# Patient Record
Sex: Female | Born: 1985 | Race: Black or African American | Hispanic: No | Marital: Single | Smoking: Never smoker
Health system: Southern US, Community
[De-identification: ages and names within clinical notes are randomized; demographics above are authoritative.]

## PROBLEM LIST (undated history)

## (undated) DIAGNOSIS — Z789 Other specified health status: Secondary | ICD-10-CM

## (undated) DIAGNOSIS — I10 Essential (primary) hypertension: Secondary | ICD-10-CM

## (undated) HISTORY — DX: Essential (primary) hypertension: I10

---
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
## 2007-12-14 ENCOUNTER — Other Ambulatory Visit: Admission: RE | Admit: 2007-12-14 | Discharge: 2007-12-14 | Payer: Self-pay | Admitting: Obstetrics and Gynecology

## 2010-04-15 ENCOUNTER — Encounter: Admission: RE | Admit: 2010-04-15 | Discharge: 2010-04-15 | Payer: Self-pay | Admitting: Obstetrics and Gynecology

## 2010-10-25 NOTE — Op Note (Signed)
Megan Moss, Megan Moss           ACCOUNT NO.:  [id number]   MEDICAL RECORD NO.:  [id number]          PATIENT TYPE:  AMB   LOCATION:  DSC                          FACILITY:  [facility]   PHYSICIAN:  Rose Phi. Maple Hudson, M.D.   DATE OF BIRTH:  04/29/1986   DATE OF PROCEDURE:  05/29/2006  DATE OF DISCHARGE:                               OPERATIVE REPORT   PREOPERATIVE DIAGNOSIS:  Bilateral fibroadenomas of the breast.   POSTOPERATIVE DIAGNOSIS:  Bilateral fibroadenomas of the breast.   OPERATION:  Excision of bilateral breast masses.   SURGEON:  Rose Phi. Maple Hudson, M.D.   ANESTHESIA:  General.   OPERATIVE PROCEDURE:  This 25 year old female had presented with masses  about six months ago.  At that time, she had ultrasound, and it was  suggested that she have a 7-month follow-up ultrasound.  On the 9-month  follow-up, both masses seemed to have gotten somewhat larger, and she  was then referred to [place] for evaluation for excision.   After suitable general anesthesia was induced, the patient was placed in  the supine position with arms extended on the arm board.  Both breasts  were prepped and draped in the usual fashion.   The palpable mass on the left side was right at the 2:30 to 3 o'clock  position, so I designed a circumareolar incision and made the incision  and exposed the fibroadenoma.  A traction suture was placed in it and  then, with traction, we excised it.  Had good hemostasis with cautery.  I reconstructed the deeper breast tissue with interrupted 3-0 Vicryl and  then a subcuticular closure of 4-0 Monocryl with Dermabond was carried  out.   We then turned our attention to the right side.  This palpable area,  which was larger than the left, was at the 11:30 to 12 o'clock position.  Again, a circumareolar incision was made with exposure of the  fibroadenoma, and then we excised it, cutting with the cautery.   Again, with good hemostasis obtained with cautery, I reconstructed  the  deeper breast tissue to eliminate the defect.  The skin was approximated  with a subcuticular 4-0 Monocryl with Dermabond.   Dressings and then an Ace bandage were applied and the patient  transferred to the recovery room in satisfactory condition having  tolerated the procedure well.     Rose Phi. Maple Hudson, M.D.  Electronically Signed    PRY/MEDQ  D:  05/29/2006  T:  05/30/2006  Job:  [id number]

## 2011-07-16 ENCOUNTER — Other Ambulatory Visit: Payer: Self-pay | Admitting: Obstetrics and Gynecology

## 2011-07-16 DIAGNOSIS — N63 Unspecified lump in unspecified breast: Secondary | ICD-10-CM

## 2011-07-18 ENCOUNTER — Inpatient Hospital Stay: Admission: RE | Admit: 2011-07-18 | Payer: Self-pay | Source: Ambulatory Visit

## 2011-07-31 ENCOUNTER — Ambulatory Visit
Admission: RE | Admit: 2011-07-31 | Discharge: 2011-07-31 | Disposition: A | Discharge status: Home / Self Care | Payer: BC Managed Care – PPO | Source: Ambulatory Visit | Attending: Obstetrics and Gynecology | Admitting: Obstetrics and Gynecology

## 2011-07-31 DIAGNOSIS — N63 Unspecified lump in unspecified breast: Secondary | ICD-10-CM

## 2013-06-30 ENCOUNTER — Other Ambulatory Visit: Payer: Self-pay | Admitting: *Deleted

## 2013-06-30 NOTE — Telephone Encounter (Signed)
Incoming [facility] from [facility] requesting Nuvaring.   Last AEX 08/11/2012. Last refill 08/11/12 #3/3 refills. Next AEX scheduled for 08/12/2013  Patient should have enough refills until her appt on 08/2013. We will refill medication for 1 year when she comes for her AEX.

## 2013-08-09 ENCOUNTER — Encounter: Payer: Self-pay | Admitting: Obstetrics and Gynecology

## 2013-08-12 ENCOUNTER — Ambulatory Visit: Payer: Self-pay | Admitting: Obstetrics and Gynecology

## 2013-08-12 ENCOUNTER — Encounter: Payer: Self-pay | Admitting: Obstetrics and Gynecology

## 2014-05-26 ENCOUNTER — Ambulatory Visit (INDEPENDENT_AMBULATORY_CARE_PROVIDER_SITE_OTHER): Payer: BC Managed Care – PPO

## 2014-05-26 ENCOUNTER — Ambulatory Visit (INDEPENDENT_AMBULATORY_CARE_PROVIDER_SITE_OTHER): Payer: BC Managed Care – PPO | Admitting: Family Medicine

## 2014-05-26 VITALS — BP 118/70 | HR 80 | Temp 97.9°F | Resp 18 | Ht 68.0 in | Wt 166.0 lb

## 2014-05-26 DIAGNOSIS — M79671 Pain in right foot: Secondary | ICD-10-CM

## 2014-05-26 DIAGNOSIS — S93601A Unspecified sprain of right foot, initial encounter: Secondary | ICD-10-CM

## 2014-05-26 MED ORDER — DICLOFENAC SODIUM 75 MG PO TBEC: 75.0000 mg | DELAYED_RELEASE_TABLET | Freq: Two times a day (BID) | ORAL | Status: DC

## 2014-05-26 NOTE — Progress Notes (Signed)
° °  Subjective:    Patient ID: Megan Moss, female    DOB: 1985-07-29, 28 y.o.   MRN: [id number] This chart was scribed for Elvina SidleKurt Lauenstein, MD by Littie Deedsichard Sun, Medical Scribe. This patient was seen in Room 3 and the patient's care was started at 10:17 AM.    HPI HPI Comments: Megan Moss is a [age] y.o. female who presents to the [facility] complaining of right ankle injury that occurred PTA when she missed a step when coming down stairs. Patient states she twisted her ankle and reports pain to her right ankle and foot. She denies any knee pain. Patient also denies possibility of pregnancy.  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   Review of Systems  Musculoskeletal: Positive for myalgias and arthralgias.       Objective:   Physical Exam CONSTITUTIONAL: Well developed/well nourished HEAD: Normocephalic/atraumatic EYES: EOM/PERRL ENMT: Mucous membranes moist NECK: supple no meningeal signs SPINE: entire spine nontender CV: S1/S2 noted, no murmurs/rubs/gallops noted LUNGS: Lungs are clear to auscultation bilaterally, no apparent distress ABDOMEN: soft, nontender, no rebound or guarding GU: no cva tenderness NEURO: Pt is awake/alert, moves all extremitiesx4 EXTREMITIES: pulses normal, full ROM SKIN: warm, color normal PSYCH: no abnormalities of mood noted  [facility] reading (PRIMARY) by  Dr. Milus GlazierLauenstein:  Negative foot and ankle films.   Mild swelling dorsal right foot     Assessment & Plan:   This chart was scribed in my presence and reviewed by me personally.    ICD-9-CM ICD-10-CM   1. Right foot pain 729.5 M79.671 DG Ankle Complete Right     DG Foot Complete Right     diclofenac (VOLTAREN) 75 MG EC tablet  2. Right foot sprain, initial encounter 845.10 S93.601A diclofenac (VOLTAREN) 75 MG EC tablet   Right foot pain - Plan: DG Ankle Complete Right, DG Foot Complete Right, diclofenac  (VOLTAREN) 75 MG EC tablet  Right foot sprain, initial encounter - Plan: diclofenac (VOLTAREN) 75 MG EC tablet    Signed, Elvina SidleKurt Lauenstein, MD

## 2014-05-26 NOTE — Patient Instructions (Signed)
Foot Sprain The muscles and cord like structures which attach muscle to bone (tendons) that surround the feet are made up of units. A foot sprain can occur at the weakest spot in any of these units. This condition is most often caused by injury to or overuse of the foot, as from playing contact sports, or aggravating a previous injury, or from poor conditioning, or obesity. SYMPTOMS  Pain with movement of the foot.  Tenderness and swelling at the injury site.  Loss of strength is present in moderate or severe sprains. THE THREE GRADES OR SEVERITY OF FOOT SPRAIN ARE:  Mild (Grade I): Slightly pulled muscle without tearing of muscle or tendon fibers or loss of strength.  Moderate (Grade II): Tearing of fibers in a muscle, tendon, or at the attachment to bone, with small decrease in strength.  Severe (Grade III): Rupture of the muscle-tendon-bone attachment, with separation of fibers. Severe sprain requires surgical repair. Often repeating (chronic) sprains are caused by overuse. Sudden (acute) sprains are caused by direct injury or over-use. DIAGNOSIS  Diagnosis of this condition is usually by your own observation. If problems continue, a caregiver may be required for further evaluation and treatment. X-rays may be required to make sure there are not breaks in the bones (fractures) present. Continued problems may require physical therapy for treatment. PREVENTION  Use strength and conditioning exercises appropriate for your sport.  Warm up properly prior to working out.  Use athletic shoes that are made for the sport you are participating in.  Allow adequate time for healing. Early return to activities makes repeat injury more likely, and can lead to an unstable arthritic foot that can result in prolonged disability. Mild sprains generally heal in 3 to 10 days, with moderate and severe sprains taking 2 to 10 weeks. Your caregiver can help you determine the proper time required for  healing. HOME CARE INSTRUCTIONS   Apply ice to the injury for 15-20 minutes, 03-04 times per day. Put the ice in a plastic bag and place a towel between the bag of ice and your skin.  An elastic wrap (like an Ace bandage) may be used to keep swelling down.  Keep foot above the level of the heart, or at least raised on a footstool, when swelling and pain are present.  Try to avoid use other than gentle range of motion while the foot is painful. Do not resume use until instructed by your caregiver. Then begin use gradually, not increasing use to the point of pain. If pain does develop, decrease use and continue the above measures, gradually increasing activities that do not cause discomfort, until you gradually achieve normal use.  Use crutches if and as instructed, and for the length of time instructed.  Keep injured foot and ankle wrapped between treatments.  Massage foot and ankle for comfort and to keep swelling down. Massage from the toes up towards the knee.  Only take over-the-counter or prescription medicines for pain, discomfort, or fever as directed by your caregiver. SEEK IMMEDIATE MEDICAL CARE IF:   Your pain and swelling increase, or pain is not controlled with medications.  You have loss of feeling in your foot or your foot turns cold or blue.  You develop new, unexplained symptoms, or an increase of the symptoms that brought you to your caregiver. MAKE SURE YOU:   Understand these instructions.  Will watch your condition.  Will get help right away if you are not doing well or get worse. Document Released:   11/15/2001 Document Revised: 08/18/2011 Document Reviewed: 01/13/2008 ExitCare Patient Information 2015 [employer]. This information is not intended to replace advice given to you by your health care provider. Make sure you discuss any questions you have with your health care provider.  

## 2016-01-13 IMAGING — CR DG ANKLE COMPLETE 3+V*R*
3 series · 3 of 3 positions shown · non-contrast
Comparison: None.

CLINICAL DATA: Tripping injury, fall, lateral ankle pain.

EXAM:
RIGHT ANKLE - COMPLETE 3+ VIEW

[AP]
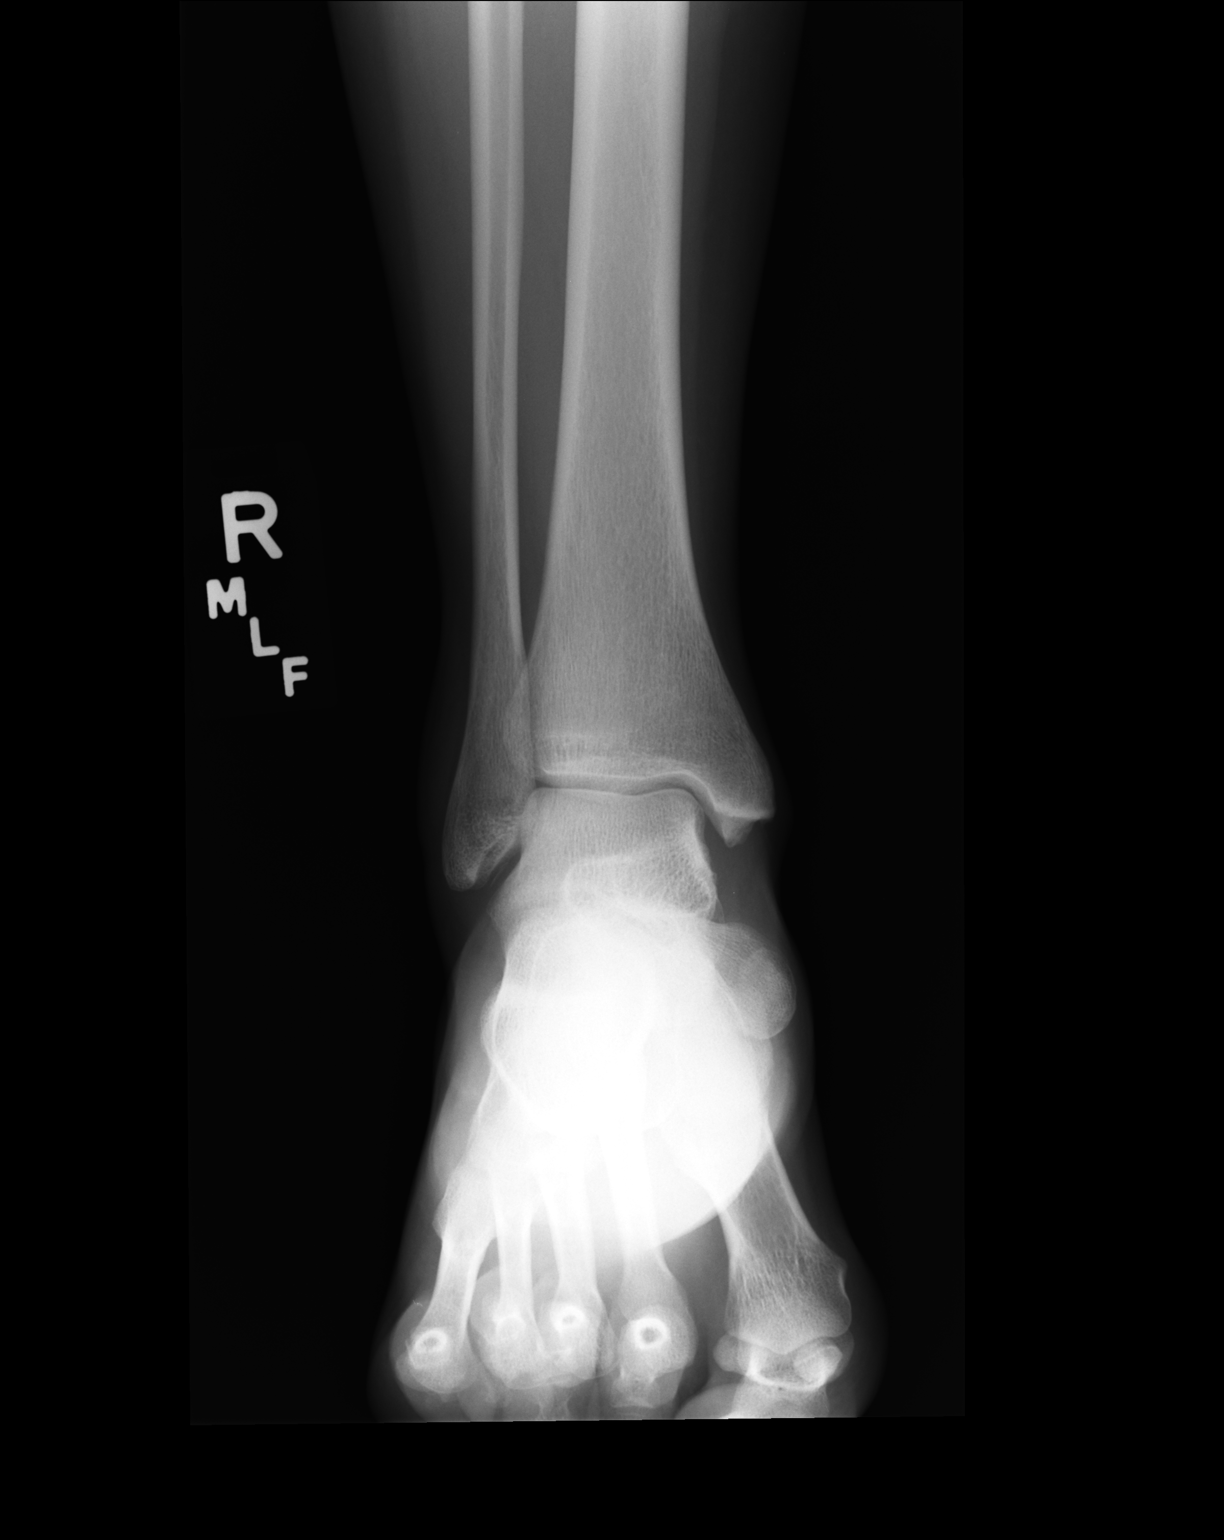

[ap obl int rot]
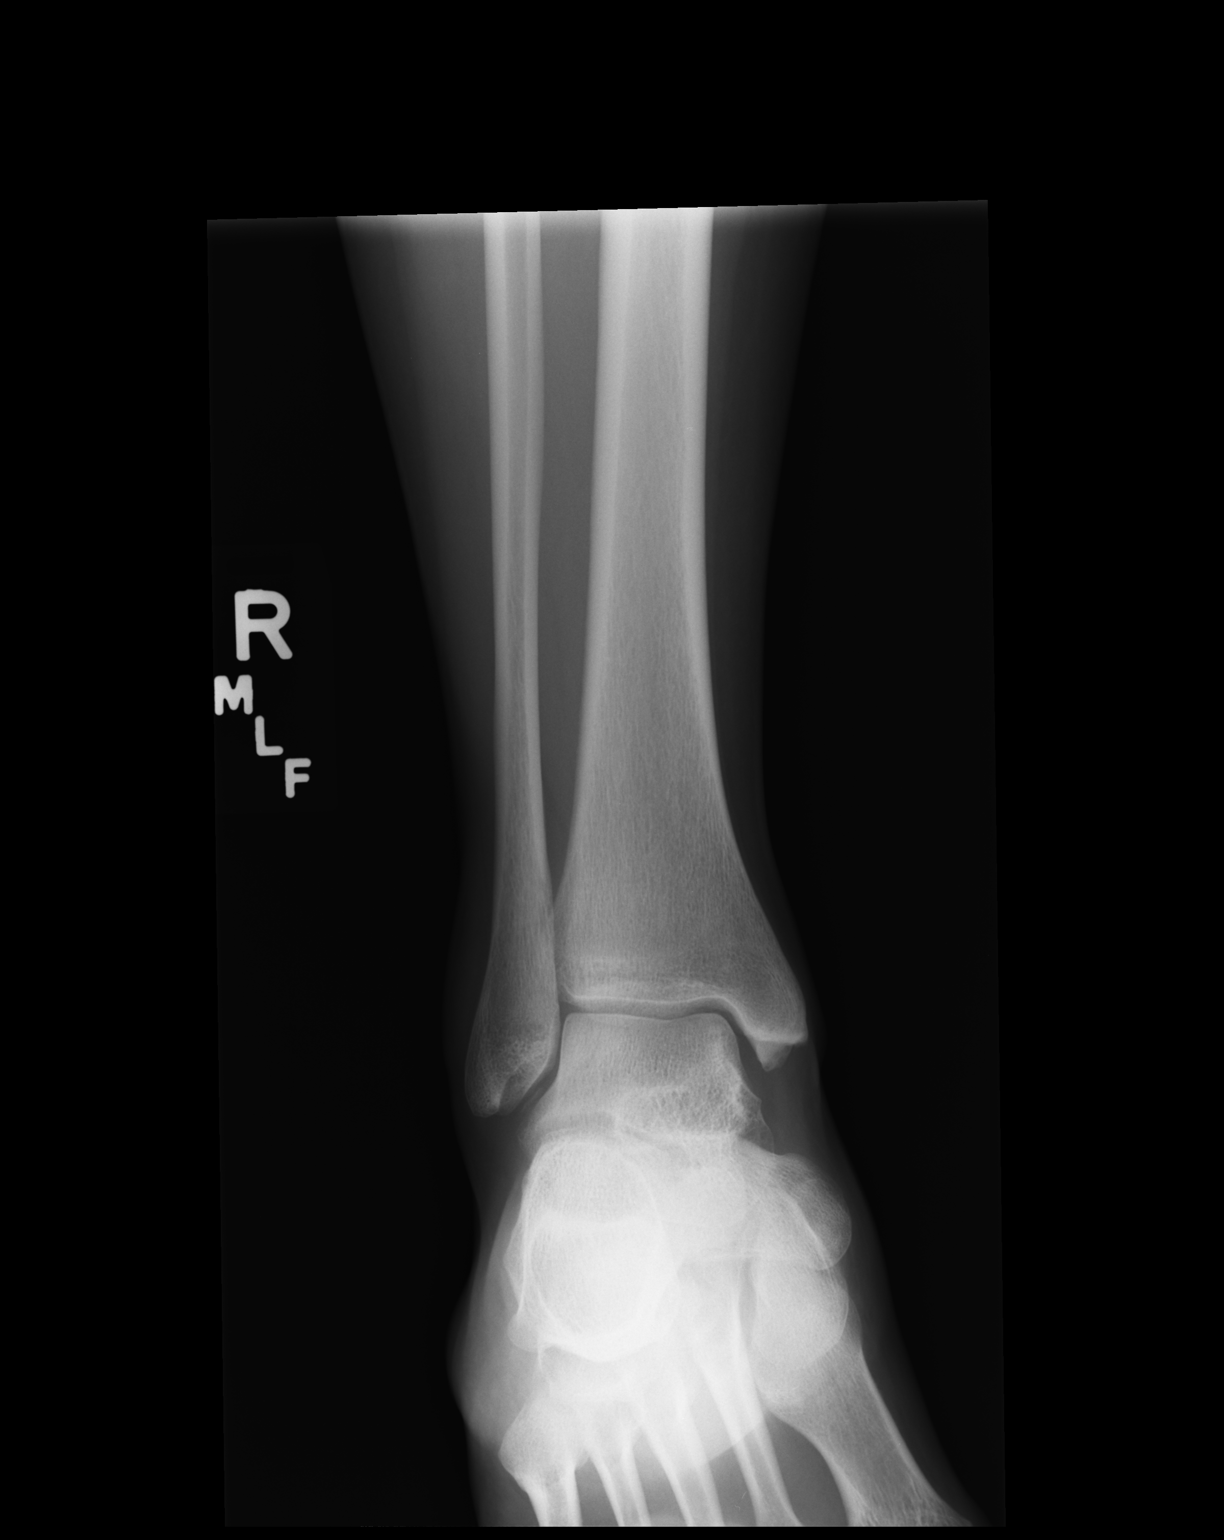

[ap obl ext rot]
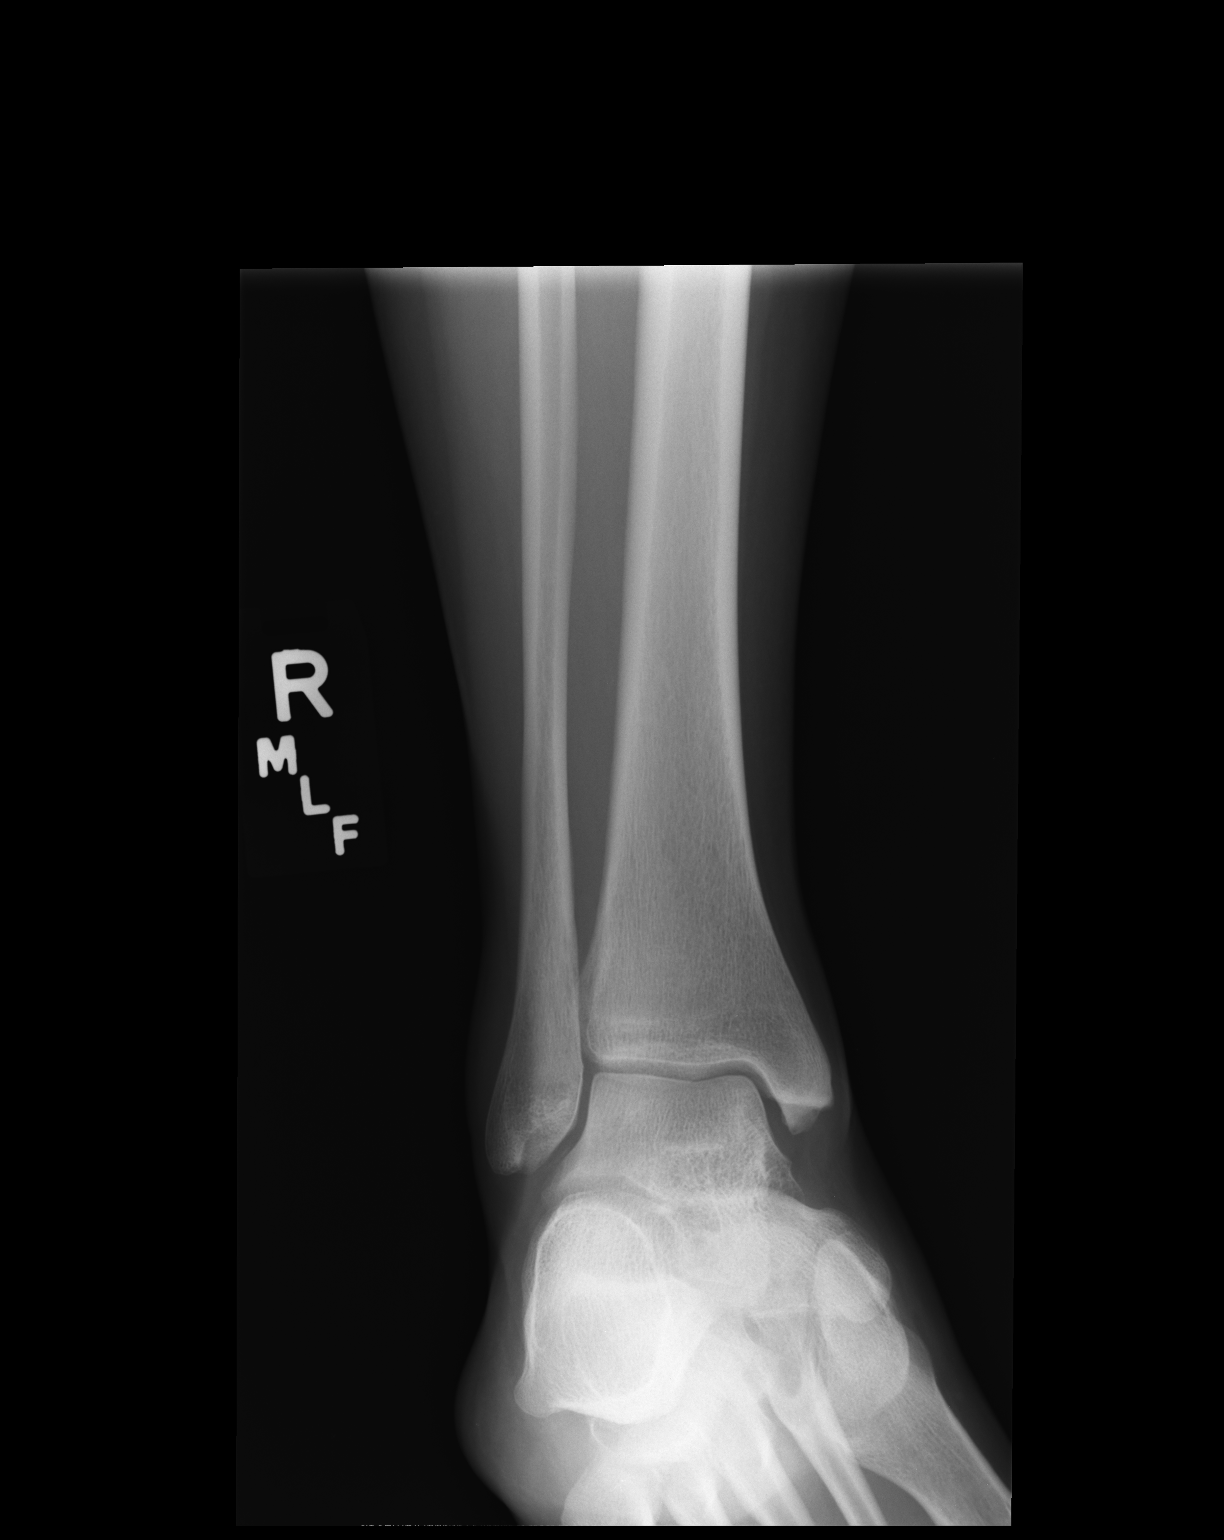

[3 of 3 positions shown; findings below may reference images not displayed]

FINDINGS: A linear osseous density is seen along the medial margin of the
distal fibula. Otherwise, no evidence of fracture. Minimal soft
tissue swelling over the lateral malleolus.
IMPRESSION: Suspect a small nondisplaced avulsion fracture off the medial margin
of the distal fibula.

## 2018-01-18 NOTE — Progress Notes (Signed)
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 I supported by listening and  giving spiritual support. She went back to school and  She graduated this July.

## 2018-08-16 ENCOUNTER — Encounter (HOSPITAL_COMMUNITY): Payer: Self-pay | Admitting: Emergency Medicine

## 2018-08-16 ENCOUNTER — Ambulatory Visit (HOSPITAL_COMMUNITY)
Admission: EM | Admit: 2018-08-16 | Discharge: 2018-08-16 | Disposition: A | Discharge status: Home / Self Care | Payer: 59 | Attending: Internal Medicine | Admitting: Internal Medicine

## 2018-08-16 ENCOUNTER — Telehealth (HOSPITAL_COMMUNITY): Payer: Self-pay | Admitting: Emergency Medicine

## 2018-08-16 DIAGNOSIS — L03032 Cellulitis of left toe: Secondary | ICD-10-CM

## 2018-08-16 MED ORDER — IBUPROFEN 800 MG PO TABS: 800.0000 mg | ORAL_TABLET | Freq: Three times a day (TID) | ORAL | 0 refills | Status: DC

## 2018-08-16 MED ORDER — FLUCONAZOLE 150 MG PO TABS: 150.0000 mg | ORAL_TABLET | Freq: Every day | ORAL | 0 refills | Status: DC

## 2018-08-16 MED ORDER — CEPHALEXIN 500 MG PO CAPS: 500.0000 mg | ORAL_CAPSULE | Freq: Four times a day (QID) | ORAL | 0 refills | Status: DC

## 2018-08-16 NOTE — Discharge Instructions (Signed)
Start keflex as directed. Ibuprofen 800mg  three times a day for pain and swelling. Warm compress. Monitor for spreading redness, increased warmth, follow up for reevaluation needed.  Diflucan called in, can start if having yeast symptoms such as vaginal itching, discharge.

## 2018-08-16 NOTE — ED Triage Notes (Signed)
Pt sts toe pain on left foot after having pedicure; pt sts swollen and painful

## 2018-08-16 NOTE — ED Provider Notes (Signed)
[facility]    CSN: [id number] Arrival date & time: 08/16/18  1128     History   Chief Complaint Chief Complaint  Patient presents with  . Foot Pain    HPI Megan Moss is a 33 y.o. female.   33 year old female comes in for few day history of left toe pain after pedicure. States pain is now causing trouble walking. Denies spreading erythema, warmth, fever. Denies numbness/tingling. Has not taken anything for the symptoms.      Past Medical History:  Diagnosis Date  . Hypertension     There are no active problems to display for this patient.   Past Surgical History:  Procedure Laterality Date  . BREAST FIBROADENOMA SURGERY Bilateral 2007    OB History    Gravida  0   Para  0   Term  0   Preterm  0   AB  0   Living  0     SAB  0   TAB  0   Ectopic  0   Multiple  0   Live Births               Home Medications    Prior to Admission medications   Medication Sig Start Date End Date Taking? Authorizing Provider  cephALEXin (KEFLEX) 500 MG capsule Take 1 capsule (500 mg total) by mouth 4 (four) times daily. 08/16/18   Belinda Fisher, PA-C  etonogestrel-ethinyl estradiol (NUVARING) 0.12-0.015 MG/24HR vaginal ring Place 1 each vaginally every 28 (twenty-eight) days. Insert vaginally and leave in place for 3 consecutive weeks, then remove for 1 week.    [provider]  fluconazole (DIFLUCAN) 150 MG tablet Take 1 tablet (150 mg total) by mouth daily. Take second dose 72 hours later if symptoms still persists. 08/16/18   Belinda Fisher, PA-C    Family History Family History  Problem Relation Age of Onset  . Hypertension Mother   . Heart murmur Mother 67  . Hypertension Father   . Diabetes Maternal Grandmother   . Hypertension Maternal Grandmother   . Diabetes Maternal Grandfather   . Cancer Maternal Grandfather        prostate cancer  . Hypertension Maternal Grandfather   . Diabetes Paternal Grandmother   . Hypertension Paternal  Grandmother   . Diabetes Paternal Grandfather   . Hypertension Paternal Grandfather     Social History Social History   Tobacco Use  . Smoking status: Never Smoker  Substance Use Topics  . Alcohol use: No    Alcohol/week: 0.0 standard drinks  . Drug use: No     Allergies   Patient has no known allergies.   Review of Systems Review of Systems  Reason unable to perform ROS: See HPI as above.     Physical Exam Triage Vital Signs ED Triage Vitals  Enc Vitals Group     BP 08/16/18 1210 125/70     Pulse Rate 08/16/18 1210 (!) 59     Resp 08/16/18 1210 18     Temp 08/16/18 1210 98.2 F (36.8 C)     Temp Source 08/16/18 1210 Temporal     SpO2 08/16/18 1210 100 %     Weight --      Height --      Head Circumference --      Peak Flow --      Pain Score 08/16/18 1211 9     Pain Loc --  Pain Edu? --      Excl. in GC? --    No data found.  Updated Vital Signs BP 125/70 (BP Location: Right Arm)   Pulse (!) 59   Temp 98.2 F (36.8 C) (Temporal)   Resp 18   SpO2 100%   Physical Exam Constitutional:      General: She is not in acute distress.    Appearance: She is well-developed. She is not diaphoretic.  HENT:     Head: Normocephalic and atraumatic.  Eyes:     Conjunctiva/sclera: Conjunctivae normal.     Pupils: Pupils are equal, round, and reactive to light.  Musculoskeletal:     Comments: Swelling to the cuticles of left 1-3rd toe. Left great toe with erythema, warmth. Small blister to the great toe. Tenderness to palpation along the cuticles. Decreased ROM due to pain. Sensation intact. Pedal pulse 2+, cap refill <2s  Neurological:     Mental Status: She is alert and oriented to person, place, and time.    UC Treatments / Results  Labs (all labs ordered are listed, but only abnormal results are displayed) Labs Reviewed - No data to display  EKG None  Radiology No results found.  Procedures Procedures (including critical care  time)  Medications Ordered in UC Medications - No data to display  Initial Impression / Assessment and Plan / UC Course  I have reviewed the triage vital signs and the nursing notes.  Pertinent labs & imaging results that were available during my care of the patient were reviewed by me and considered in my medical decision making (see chart for details).    Start keflex as directed. Warm compress. NSAIDs for pain and inflammation. Return precautions given. Patient expresses understanding and agrees to plan.  Final Clinical Impressions(s) / UC Diagnoses   Final diagnoses:  Cellulitis of toe of left foot    ED Prescriptions    Medication Sig Dispense Auth. Provider   cephALEXin (KEFLEX) 500 MG capsule Take 1 capsule (500 mg total) by mouth 4 (four) times daily. 28 capsule Ethin Drummond V, PA-C   fluconazole (DIFLUCAN) 150 MG tablet Take 1 tablet (150 mg total) by mouth daily. Take second dose 72 hours later if symptoms still persists. 2 tablet Threasa Alpha, PA-C 08/16/18 1318

## 2018-08-20 ENCOUNTER — Other Ambulatory Visit: Payer: Self-pay

## 2018-08-20 ENCOUNTER — Ambulatory Visit (HOSPITAL_COMMUNITY)
Admission: EM | Admit: 2018-08-20 | Discharge: 2018-08-20 | Disposition: A | Discharge status: Home / Self Care | Payer: 59 | Attending: Family Medicine | Admitting: Family Medicine

## 2018-08-20 DIAGNOSIS — L03032 Cellulitis of left toe: Secondary | ICD-10-CM | POA: Diagnosis not present

## 2018-08-20 MED ORDER — CEPHALEXIN 500 MG PO CAPS: 500.0000 mg | ORAL_CAPSULE | Freq: Four times a day (QID) | ORAL | 0 refills | Status: DC

## 2018-08-20 NOTE — Discharge Instructions (Signed)
Continue Keflex for 7 more days Continue warm soaks 1-2 times a day Take the Diflucan 1 now and then 1 again in 7 days return if you fail to improve Consider establishing with a PCP

## 2018-08-20 NOTE — ED Triage Notes (Signed)
Pt returning due to infection on her left foot after a pedicure. Swelling has gone down but still has a puss pocket and dark toes. No fevers, Nor chills and pt able to move toes and was not able to when this happen

## 2018-08-20 NOTE — ED Provider Notes (Signed)
[facility]    CSN: [id number] Arrival date & time: 08/20/18  1640     History   Chief Complaint Chief Complaint  Patient presents with  . Follow-up    HPI Megan Moss is a 33 y.o. female.   HPI Patient is taking her Keflex as requested.  She did not take any Diflucan.  She has some peeling between her toes and around the edges of her toenail.  The skin around the edges of the toenails on the left foot for the first second third toes has turned dark.  She wants to know if this is healing appropriately.  The pain is much improved.  Swelling is down.  No drainage.  The first toenail appears to be thickened as if it is going to fall off, there is polish on the nails which makes it very difficult to do further evaluation Past Medical History:  Diagnosis Date  . Hypertension     There are no active problems to display for this patient.   Past Surgical History:  Procedure Laterality Date  . BREAST FIBROADENOMA SURGERY Bilateral 2007    OB History    Gravida  0   Para  0   Term  0   Preterm  0   AB  0   Living  0     SAB  0   TAB  0   Ectopic  0   Multiple  0   Live Births               Home Medications    Prior to Admission medications   Medication Sig Start Date End Date Taking? Authorizing Provider  cephALEXin (KEFLEX) 500 MG capsule Take 1 capsule (500 mg total) by mouth 4 (four) times daily. 08/20/18   Eustace Moore, MD  etonogestrel-ethinyl estradiol (NUVARING) 0.12-0.015 MG/24HR vaginal ring Place 1 each vaginally every 28 (twenty-eight) days. Insert vaginally and leave in place for 3 consecutive weeks, then remove for 1 week.    [provider]  fluconazole (DIFLUCAN) 150 MG tablet Take 1 tablet (150 mg total) by mouth daily. Take second dose 72 hours later if symptoms still persists. 08/16/18   Cathie Hoops, Amy V, PA-C  ibuprofen (ADVIL,MOTRIN) 800 MG tablet Take 1 tablet (800 mg total) by mouth 3 (three) times daily.  08/16/18   Belinda Fisher, PA-C    Family History Family History  Problem Relation Age of Onset  . Hypertension Mother   . Heart murmur Mother 5  . Hypertension Father   . Diabetes Maternal Grandmother   . Hypertension Maternal Grandmother   . Diabetes Maternal Grandfather   . Cancer Maternal Grandfather        prostate cancer  . Hypertension Maternal Grandfather   . Diabetes Paternal Grandmother   . Hypertension Paternal Grandmother   . Diabetes Paternal Grandfather   . Hypertension Paternal Grandfather     Social History Social History   Tobacco Use  . Smoking status: Never Smoker  Substance Use Topics  . Alcohol use: No    Alcohol/week: 0.0 standard drinks  . Drug use: No     Allergies   Patient has no known allergies.   Review of Systems Review of Systems  Constitutional: Negative for chills and fever.  HENT: Negative for ear pain and sore throat.   Eyes: Negative for pain and visual disturbance.  Respiratory: Negative for cough and shortness of breath.   Cardiovascular: Negative for chest pain and palpitations.  Gastrointestinal: Negative for abdominal pain and vomiting.  Genitourinary: Negative for dysuria and hematuria.  Musculoskeletal: Negative for arthralgias and back pain.  Skin: Positive for color change. Negative for rash.  Neurological: Negative for seizures and syncope.  All other systems reviewed and are negative.    Physical Exam Triage Vital Signs ED Triage Vitals  Enc Vitals Group     BP 08/20/18 1750 (!) 131/98     Pulse Rate 08/20/18 1750 72     Resp 08/20/18 1750 16     Temp 08/20/18 1750 98.5 F (36.9 C)     Temp Source 08/20/18 1750 Oral     SpO2 08/20/18 1750 100 %     Weight 08/20/18 1752 165 lb (74.8 kg)     Height 08/20/18 1752 5\' 6"  (1.676 m)   No data found.  Updated Vital Signs BP (!) 131/98 (BP Location: Right Arm)   Pulse 72   Temp 98.5 F (36.9 C) (Oral)   Resp 16   Ht 5\' 6"  (1.676 m)   Wt 74.8 kg   SpO2 100%    BMI 26.63 kg/m       Physical Exam Constitutional:      General: She is not in acute distress.    Appearance: She is well-developed and normal weight.  HENT:     Head: Normocephalic and atraumatic.  Eyes:     Conjunctiva/sclera: Conjunctivae normal.     Pupils: Pupils are equal, round, and reactive to light.  Neck:     Musculoskeletal: Normal range of motion.  Cardiovascular:     Rate and Rhythm: Normal rate and regular rhythm.     Heart sounds: Normal heart sounds.  Pulmonary:     Effort: Pulmonary effort is normal. No respiratory distress.     Breath sounds: Normal breath sounds.  Abdominal:     General: There is no distension.     Palpations: Abdomen is soft.  Musculoskeletal: Normal range of motion.  Skin:    General: Skin is warm and dry.     Comments: Toes on the L foot 1 - 2 - 3 and part of 4th are hyperpigmented.  There is maceration and some blistering between the 1 and 2 toes  Neurological:     Mental Status: She is alert.      UC Treatments / Results  Labs (all labs ordered are listed, but only abnormal results are displayed) Labs Reviewed - No data to display  EKG None  Radiology No results found.  Procedures Procedures (including critical care time)  Medications Ordered in UC Medications - No data to display  Initial Impression / Assessment and Plan / UC Course  I have reviewed the triage vital signs and the nursing notes.  Pertinent labs & imaging results that were available during my care of the patient were reviewed by me and considered in my medical decision making (see chart for details).    Healing - no complication noted  Final Clinical Impressions(s) / UC Diagnoses   Final diagnoses:  Paronychia of great toe, left     Discharge Instructions     Continue Keflex for 7 more days Continue warm soaks 1-2 times a day Take the Diflucan 1 now and then 1 again in 7 days return if you fail to improve Consider establishing with a PCP     ED Prescriptions    Medication Sig Dispense Auth. Provider   cephALEXin (KEFLEX) 500 MG capsule Take 1 capsule (500 mg total) by mouth  4 (four) times daily. 28 capsule Eustace Moore, MD     Controlled Substance Prescriptions [place] Controlled Substance Registry consulted? Not Applicable   Eustace Moore, MD 08/20/18 [phone]

## 2019-06-10 NOTE — L&D Delivery Note (Signed)
Delivery Note At 6:55 AM a viable and healthy female was delivered via Vaginal, Spontaneous (Presentation: Right Occiput Anterior).  APGAR: 9, 9; weight  .   Placenta status: Spontaneous, Intact.  Cord: 3 vessels with the following complications: None.    Pt presented to [facility] completely dilated after being sent home earlier in the morning. She spontaneously ruptured membranes on the way to labor and delivery for meconium stained fluid. She pushed for approximately 20 minutes without epidural and delivered a vigorous female infant in the vertex ROA presentation with apgars of 9 and 9. Pt is GBS positive and did not receive adequate treatment. The placenta delivered spontaneously, intact with 3VC. After a 1 minute delay in cord clamping. A 3rd degree laceration was noted. The perineum was infiltrated with 1% lidocaine. Alice clamps were used to grasp the external sphincter muscle and the 4 interupted sutures were used to reapproximate the muscle on all 4 sides. A finger was placed in the rectum to ensure complete re-approximation. A second degree perineal laceration was repaired with 3-0 vicryl in the usual fashion. EBL 227cc Mother and baby are doing well after delivery.  Anesthesia: 1% lidocaine  Episiotomy: None Lacerations: 3rd degree Suture Repair: 3.0 vicryl, 0 vicryl Est. Blood Loss (mL):  227 cc  Mom to postpartum.  Baby to Couplet care / Skin to Skin.  Waynard Reeds 02/27/2020, 7:46 AM

## 2019-07-04 ENCOUNTER — Inpatient Hospital Stay (HOSPITAL_COMMUNITY): Payer: 59

## 2019-07-04 ENCOUNTER — Other Ambulatory Visit: Payer: Self-pay

## 2019-07-04 ENCOUNTER — Inpatient Hospital Stay (HOSPITAL_COMMUNITY)
Admission: AD | Admit: 2019-07-04 | Discharge: 2019-07-04 | Disposition: A | Discharge status: Home / Self Care | Payer: 59 | Attending: Obstetrics & Gynecology | Admitting: Obstetrics & Gynecology

## 2019-07-04 ENCOUNTER — Encounter (HOSPITAL_COMMUNITY): Payer: Self-pay | Admitting: Obstetrics & Gynecology

## 2019-07-04 DIAGNOSIS — O98811 Other maternal infectious and parasitic diseases complicating pregnancy, first trimester: Secondary | ICD-10-CM | POA: Diagnosis not present

## 2019-07-04 DIAGNOSIS — R109 Unspecified abdominal pain: Secondary | ICD-10-CM | POA: Diagnosis not present

## 2019-07-04 DIAGNOSIS — Z3A08 8 weeks gestation of pregnancy: Secondary | ICD-10-CM | POA: Diagnosis not present

## 2019-07-04 DIAGNOSIS — M549 Dorsalgia, unspecified: Secondary | ICD-10-CM | POA: Insufficient documentation

## 2019-07-04 DIAGNOSIS — Z3491 Encounter for supervision of normal pregnancy, unspecified, first trimester: Secondary | ICD-10-CM | POA: Diagnosis not present

## 2019-07-04 DIAGNOSIS — Z3A01 Less than 8 weeks gestation of pregnancy: Secondary | ICD-10-CM | POA: Diagnosis not present

## 2019-07-04 DIAGNOSIS — B3731 Acute candidiasis of vulva and vagina: Secondary | ICD-10-CM

## 2019-07-04 DIAGNOSIS — O161 Unspecified maternal hypertension, first trimester: Secondary | ICD-10-CM | POA: Insufficient documentation

## 2019-07-04 DIAGNOSIS — O209 Hemorrhage in early pregnancy, unspecified: Secondary | ICD-10-CM | POA: Diagnosis not present

## 2019-07-04 DIAGNOSIS — B373 Candidiasis of vulva and vagina: Secondary | ICD-10-CM | POA: Insufficient documentation

## 2019-07-04 DIAGNOSIS — O26891 Other specified pregnancy related conditions, first trimester: Secondary | ICD-10-CM | POA: Insufficient documentation

## 2019-07-04 LAB — CBC
HCT: 38.4 % (ref 36.0–46.0)
Hemoglobin: 12.7 g/dL (ref 12.0–15.0)
MCH: 29.2 pg (ref 26.0–34.0)
MCHC: 33.1 g/dL (ref 30.0–36.0)
MCV: 88.3 fL (ref 80.0–100.0)
Platelets: 323 10*3/uL (ref 150–400)
RBC: 4.35 MIL/uL (ref 3.87–5.11)
RDW: 13.6 % (ref 11.5–15.5)
WBC: 10.8 10*3/uL — ABNORMAL HIGH (ref 4.0–10.5)
nRBC: 0 % (ref 0.0–0.2)

## 2019-07-04 LAB — HCG, QUANTITATIVE, PREGNANCY: hCG, Beta Chain, Quant, S: 4164 m[IU]/mL — ABNORMAL HIGH (ref <5)

## 2019-07-04 LAB — URINALYSIS, ROUTINE W REFLEX MICROSCOPIC
Bilirubin Urine: NEGATIVE
Glucose, UA: NEGATIVE mg/dL
Hgb urine dipstick: NEGATIVE
Ketones, ur: 5 mg/dL — AB
Leukocytes,Ua: NEGATIVE
Nitrite: NEGATIVE
Protein, ur: NEGATIVE mg/dL
Specific Gravity, Urine: 1.027 (ref 1.005–1.030)
pH: 5 (ref 5.0–8.0)

## 2019-07-04 LAB — WET PREP, GENITAL
Clue Cells Wet Prep HPF POC: NONE SEEN
Sperm: NONE SEEN
Trich, Wet Prep: NONE SEEN

## 2019-07-04 LAB — POCT PREGNANCY, URINE: Preg Test, Ur: POSITIVE — AB

## 2019-07-04 LAB — ABO/RH: ABO/RH(D): O POS

## 2019-07-04 MED ORDER — TERCONAZOLE 80 MG VA SUPP: 80.0000 mg | Freq: Every day | VAGINAL | 0 refills | Status: DC

## 2019-07-04 NOTE — MAU Provider Note (Signed)
Chief Complaint: Abdominal Pain, Back Pain, and Possible Pregnancy   First Provider Initiated Contact with Patient 07/04/19 1739     SUBJECTIVE HPI: Megan Moss is a 34 y.o. G1P0000 at [redacted]w[redacted]d who presents to Maternity Admissions reporting abdominal cramping. Symptoms started this morning. Took a positive pregnancy test yesterday. Reports have a light period in December at the appropriate time for her cycle but no bleeding episode this month. Had regular periods prior to December.  Denies n/v/d, constipation, dysuria, vaginal bleeding, or vaginal discharge.   Location: lower abdomen Quality: cramping Severity: 5/10 on pain scale Duration: 1 day Timing: intermittent Modifying factors: none Associated signs and symptoms: none  Past Medical History:  Diagnosis Date  . Hypertension    OB History  Gravida Para Term Preterm AB Living  1 0 0 0 0 0  SAB TAB Ectopic Multiple Live Births  0 0 0 0      # Outcome Date GA Lbr Len/2nd Weight Sex Delivery Anes PTL Lv  1 Current            Past Surgical History:  Procedure Laterality Date  . BREAST FIBROADENOMA SURGERY Bilateral 2007   Social History   Socioeconomic History  . Marital status: Single    Spouse name: Not on file  . Number of children: Not on file  . Years of education: Not on file  . Highest education level: Not on file  Occupational History  . Not on file  Tobacco Use  . Smoking status: Never Smoker  . Smokeless tobacco: Never Used  Substance and Sexual Activity  . Alcohol use: Yes    Alcohol/week: 0.0 standard drinks  . Drug use: No  . Sexual activity: Yes    Birth control/protection: None  Other Topics Concern  . Not on file  Social History Narrative  . Not on file   Social Determinants of Health   Financial Resource Strain:   . Difficulty of Paying Living Expenses: Not on file  Food Insecurity:   . Worried About Charity fundraiser in the Last Year: Not on file  . Ran Out of Food in the Last  Year: Not on file  Transportation Needs:   . Lack of Transportation (Medical): Not on file  . Lack of Transportation (Non-Medical): Not on file  Physical Activity:   . Days of Exercise per Week: Not on file  . Minutes of Exercise per Session: Not on file  Stress:   . Feeling of Stress : Not on file  Social Connections:   . Frequency of Communication with Friends and Family: Not on file  . Frequency of Social Gatherings with Friends and Family: Not on file  . Attends Religious Services: Not on file  . Active Member of Clubs or Organizations: Not on file  . Attends Archivist Meetings: Not on file  . Marital Status: Not on file  Intimate Partner Violence:   . Fear of Current or Ex-Partner: Not on file  . Emotionally Abused: Not on file  . Physically Abused: Not on file  . Sexually Abused: Not on file   Family History  Problem Relation Age of Onset  . Hypertension Mother   . Heart murmur Mother 79  . Hypertension Father   . Diabetes Maternal Grandmother   . Hypertension Maternal Grandmother   . Diabetes Maternal Grandfather   . Cancer Maternal Grandfather        prostate cancer  . Hypertension Maternal Grandfather   . Diabetes  Paternal Grandmother   . Hypertension Paternal Grandmother   . Diabetes Paternal Grandfather   . Hypertension Paternal Grandfather    No current facility-administered medications on file prior to encounter.   Current Outpatient Medications on File Prior to Encounter  Medication Sig Dispense Refill  . cephALEXin (KEFLEX) 500 MG capsule Take 1 capsule (500 mg total) by mouth 4 (four) times daily. 28 capsule 0  . etonogestrel-ethinyl estradiol (NUVARING) 0.12-0.015 MG/24HR vaginal ring Place 1 each vaginally every 28 (twenty-eight) days. Insert vaginally and leave in place for 3 consecutive weeks, then remove for 1 week.    . fluconazole (DIFLUCAN) 150 MG tablet Take 1 tablet (150 mg total) by mouth daily. Take second dose 72 hours later if  symptoms still persists. 2 tablet 0  . ibuprofen (ADVIL,MOTRIN) 800 MG tablet Take 1 tablet (800 mg total) by mouth 3 (three) times daily. 21 tablet 0   No Known Allergies  I have reviewed patient's Past Medical Hx, Surgical Hx, Family Hx, Social Hx, medications and allergies.   Review of Systems  OBJECTIVE Patient Vitals for the past 24 hrs:  BP Temp Temp src Pulse Resp SpO2 Height Weight  07/04/19 1753 126/76 -- -- 91 -- -- -- --  07/04/19 1656 135/76 98.6 F (37 C) Oral 90 16 99 % 5\' 6"  (1.676 m) 84.3 kg   Constitutional: Well-developed, well-nourished female in no acute distress.  Cardiovascular: normal rate & rhythm, no murmur Respiratory: normal rate and effort. Lung sounds clear throughout GI: Abd soft, non-tender, Pos BS x 4. No guarding or rebound tenderness MS: Extremities nontender, no edema, normal ROM Neurologic: Alert and oriented x 4.     LAB RESULTS Results for orders placed or performed during the hospital encounter of 07/04/19 (from the past 24 hour(s))  Urinalysis, Routine w reflex microscopic     Status: Abnormal   Collection Time: 07/04/19  5:10 PM  Result Value Ref Range   Color, Urine YELLOW YELLOW   APPearance CLEAR CLEAR   Specific Gravity, Urine 1.027 1.005 - 1.030   pH 5.0 5.0 - 8.0   Glucose, UA NEGATIVE NEGATIVE mg/dL   Hgb urine dipstick NEGATIVE NEGATIVE   Bilirubin Urine NEGATIVE NEGATIVE   Ketones, ur 5 (A) NEGATIVE mg/dL   Protein, ur NEGATIVE NEGATIVE mg/dL   Nitrite NEGATIVE NEGATIVE   Leukocytes,Ua NEGATIVE NEGATIVE  Pregnancy, urine POC     Status: Abnormal   Collection Time: 07/04/19  5:14 PM  Result Value Ref Range   Preg Test, Ur POSITIVE (A) NEGATIVE  Wet prep, genital     Status: Abnormal   Collection Time: 07/04/19  5:46 PM  Result Value Ref Range   Yeast Wet Prep HPF POC PRESENT (A) NONE SEEN   Trich, Wet Prep NONE SEEN NONE SEEN   Clue Cells Wet Prep HPF POC NONE SEEN NONE SEEN   WBC, Wet Prep HPF POC FEW (A) NONE SEEN    Sperm NONE SEEN   CBC     Status: Abnormal   Collection Time: 07/04/19  6:09 PM  Result Value Ref Range   WBC 10.8 (H) 4.0 - 10.5 K/uL   RBC 4.35 3.87 - 5.11 MIL/uL   Hemoglobin 12.7 12.0 - 15.0 g/dL   HCT 07/06/19 72.5 - 36.6 %   MCV 88.3 80.0 - 100.0 fL   MCH 29.2 26.0 - 34.0 pg   MCHC 33.1 30.0 - 36.0 g/dL   RDW 44.0 34.7 - 42.5 %   Platelets 323 150 -  400 K/uL   nRBC 0.0 0.0 - 0.2 %  ABO/Rh     Status: None   Collection Time: 07/04/19  6:09 PM  Result Value Ref Range   ABO/RH(D) O POS    No rh immune globuloin      NOT A RH IMMUNE GLOBULIN CANDIDATE, PT RH POSITIVE Performed at [facility], [address], [place], [place] [address]   hCG, quantitative, pregnancy     Status: Abnormal   Collection Time: 07/04/19  6:09 PM  Result Value Ref Range   hCG, Beta Chain, Quant, S 4,164 (H) <5 mIU/mL    IMAGING US OB LESS THAN 14 WEEKS WITH OB TRANSVAGINAL  Result Date: 07/04/2019 CLINICAL DATA:  Pain, cramping, vaginal bleeding EXAM: OBSTETRIC <14 WK [place] AND TRANSVAGINAL OB US TECHNIQUE: Both transabdominal and transvaginal ultrasound examinations were performed for complete evaluation of the gestation as well as the maternal uterus, adnexal regions, and pelvic cul-de-sac. Transvaginal technique was performed to assess early pregnancy. COMPARISON:  None. FINDINGS: Intrauterine gestational sac: Single Yolk sac:  Visualized Embryo:  Not visualized Cardiac Activity: Not visualized Heart Rate:   bpm MSD: 5.4 mm   5 w   2 d CRL:    mm    w    d                  [place] EDC: Subchorionic hemorrhage:  None visualized. Maternal uterus/adnexae: No adnexal mass or free fluid. IMPRESSION: Early intrauterine pregnancy. Yolk sac is visualized but no fetal pole currently. Estimated gestational age by mean sac diameter 5 weeks 2 days. This could be followed with repeat ultrasound in 14 days to ensure expected progression. No acute maternal findings. Electronically Signed   By: Charlett Nose M.D.   On:  07/04/2019 19:01    MAU COURSE Orders Placed This Encounter  Procedures  . Wet prep, genital  . US OB LESS THAN 14 WEEKS WITH OB TRANSVAGINAL  . US OB Transvaginal  . Urinalysis, Routine w reflex microscopic  . CBC  . hCG, quantitative, pregnancy  . Pregnancy, urine POC  . ABO/Rh  . Discharge patient   Meds ordered this encounter  Medications  . terconazole (TERAZOL 3) 80 MG vaginal suppository    Sig: Place 1 suppository (80 mg total) vaginally at bedtime.    Dispense:  3 suppository    Refill:  0    Order Specific Question:   Supervising Provider    Answer:   Adam Phenix [[id number]]    MDM +UPT UA, wet prep, GC/chlamydia, CBC, ABO/Rh, quant hCG, and [place] today to rule out ectopic pregnancy which can be life threatening.   Ultrasound shows IUGS with yolk sac.  Will order viability ultrasound in 10 days.  Patient does not have an ob/gyn, will give list of providers  Wet prep + for yeast. Patient denies symptoms. Will send in rx for terazol if needed.   ASSESSMENT 1. Normal IUP (intrauterine pregnancy) on prenatal ultrasound, first trimester   2. Abdominal pain during pregnancy in first trimester   3. Vaginal yeast infection     PLAN Discharge home in stable condition. Discussed reasons to return to MAU F/u viability ultrasound ordered Rx terazol  Follow-up Information    [facility].   Specialty: Radiology Why: schedule ultrasound Contact information: [address] [place] [address], [address] [id number] [place] [address] [phone]         Allergies as  of 07/04/2019   No Known Allergies     Medication List    STOP taking these medications   cephALEXin 500 MG capsule Commonly known as: KEFLEX   etonogestrel-ethinyl estradiol 0.12-0.015 MG/24HR vaginal ring Commonly known as: NUVARING   fluconazole 150 MG tablet Commonly known as: Diflucan   ibuprofen 800 MG tablet Commonly known  as: ADVIL     TAKE these medications   terconazole 80 MG vaginal suppository Commonly known as: TERAZOL 3 Place 1 suppository (80 mg total) vaginally at bedtime.        Judeth Horn, NP 07/04/2019  7:23 PM

## 2019-07-04 NOTE — MAU Note (Signed)
In Dec, had some spotting, missed Jan period.  Took 4 preg tests yesterday, all were +.  Has had some cramping and back pain, just wanting to see what is going on.

## 2019-07-04 NOTE — Discharge Instructions (Signed)
Return to care   If you have heavier bleeding that soaks through more that 2 pads per hour for an hour or more  If you bleed so much that you feel like you might pass out or you do pass out  If you have significant abdominal pain that is not improved with Tylenol   If you develop a fever > 100.5    Prenatal Care Providers           [facility] [place]   Phone: [phone]  [facility] @ [place]   Phone: [phone]  [facility]       Phone: [phone]            [facility] @ [place]     Phone: [phone]          [facility] @ [facility]   Phone: [phone]  [facility] @ [place]  Phone: [phone]  [facility] @ [facility] Phone: [phone]     [facility]  Phone: [phone]  [facility]  Phone: [phone]  [phone] OB[facility]GY[facility] Phone: [phone]  [facility] Phone: [phone]  [facility] Phone: [phone]  [facility] Phone: [phone]  [facility]  Phone: [phone]     Vaginal Yeast Infection, Adult  Vaginal yeast infection is a condition that causes vaginal discharge as well as soreness, swelling, and redness (inflammation) of the vagina. This is a common condition. Some women get this infection frequently. What are the causes? This condition is caused by a change in the normal balance of the yeast (candida) and bacteria that live in the vagina. This change causes an overgrowth of yeast, which causes the inflammation. What increases the risk? The condition is more likely to develop in women who:  Take antibiotic medicines.  Have diabetes.  Take birth control pills.  Are pregnant.  Douche often.  Have a weak body defense system (immune system).  Have been taking steroid medicines for a long  time.  Frequently wear tight clothing. What are the signs or symptoms? Symptoms of this condition include:  White, thick, creamy vaginal discharge.  Swelling, itching, redness, and irritation of the vagina. The lips of the vagina (vulva) may be affected as well.  Pain or a burning feeling while urinating.  Pain during sex. How is this diagnosed? This condition is diagnosed based on:  Your medical history.  A physical exam.  A pelvic exam. Your health care provider will examine a sample of your vaginal discharge under a microscope. Your health care provider may send this sample for testing to confirm the diagnosis. How is this treated? This condition is treated with medicine. Medicines may be over-the-counter or prescription. You may be told to use one or more of the following:  Medicine that is taken by mouth (orally).  Medicine that is applied as a cream (topically).  Medicine that is inserted directly into the vagina (suppository). Follow these instructions at home:  Lifestyle  Do not have sex until your health care provider approves. Tell your sex partner that you have a yeast infection. That person should go to his or her health care provider and ask if they should also be treated.  Do not wear tight clothes, such as pantyhose or tight pants.  Wear breathable cotton underwear. General instructions  Take or apply over-the-counter and prescription medicines only as told by your health  care provider.  Eat more yogurt. This may help to keep your yeast infection from returning.  Do not use tampons until your health care provider approves.  Try taking a sitz bath to help with discomfort. This is a warm water bath that is taken while you are sitting down. The water should only come up to your hips and should cover your buttocks. Do this 3-4 times per day or as told by your health care provider.  Do not douche.  If you have diabetes, keep your blood sugar levels under  control.  Keep all follow-up visits as told by your health care provider. This is important. Contact a health care provider if:  You have a fever.  Your symptoms go away and then return.  Your symptoms do not get better with treatment.  Your symptoms get worse.  You have new symptoms.  You develop blisters in or around your vagina.  You have blood coming from your vagina and it is not your menstrual period.  You develop pain in your abdomen. Summary  Vaginal yeast infection is a condition that causes discharge as well as soreness, swelling, and redness (inflammation) of the vagina.  This condition is treated with medicine. Medicines may be over-the-counter or prescription.  Take or apply over-the-counter and prescription medicines only as told by your health care provider.  Do not douche. Do not have sex or use tampons until your health care provider approves.  Contact a health care provider if your symptoms do not get better with treatment or your symptoms go away and then return. This information is not intended to replace advice given to you by your health care provider. Make sure you discuss any questions you have with your health care provider. Document Revised: 12/24/2018 Document Reviewed: 10/12/2017 Elsevier Patient Education  [place].

## 2019-07-05 LAB — GC/CHLAMYDIA PROBE AMP (~~LOC~~) NOT AT ARMC
Chlamydia: NEGATIVE
Comment: NEGATIVE
Comment: NORMAL
Neisseria Gonorrhea: NEGATIVE

## 2019-07-12 DIAGNOSIS — Z01419 Encounter for gynecological examination (general) (routine) without abnormal findings: Secondary | ICD-10-CM | POA: Diagnosis not present

## 2019-07-12 DIAGNOSIS — Z124 Encounter for screening for malignant neoplasm of cervix: Secondary | ICD-10-CM | POA: Diagnosis not present

## 2019-07-12 DIAGNOSIS — N925 Other specified irregular menstruation: Secondary | ICD-10-CM | POA: Diagnosis not present

## 2019-07-12 DIAGNOSIS — Z3480 Encounter for supervision of other normal pregnancy, unspecified trimester: Secondary | ICD-10-CM | POA: Diagnosis not present

## 2019-07-12 DIAGNOSIS — O26841 Uterine size-date discrepancy, first trimester: Secondary | ICD-10-CM | POA: Diagnosis not present

## 2019-08-23 DIAGNOSIS — Z3480 Encounter for supervision of other normal pregnancy, unspecified trimester: Secondary | ICD-10-CM | POA: Diagnosis not present

## 2019-08-23 DIAGNOSIS — Z3682 Encounter for antenatal screening for nuchal translucency: Secondary | ICD-10-CM | POA: Diagnosis not present

## 2019-08-23 LAB — OB RESULTS CONSOLE RPR: RPR: NONREACTIVE

## 2019-08-23 LAB — OB RESULTS CONSOLE HIV ANTIBODY (ROUTINE TESTING): HIV: NONREACTIVE

## 2019-08-23 LAB — OB RESULTS CONSOLE GC/CHLAMYDIA
Chlamydia: NEGATIVE
Gonorrhea: NEGATIVE

## 2019-08-23 LAB — OB RESULTS CONSOLE ABO/RH: RH Type: POSITIVE

## 2019-08-23 LAB — OB RESULTS CONSOLE RUBELLA ANTIBODY, IGM: Rubella: IMMUNE

## 2019-08-23 LAB — OB RESULTS CONSOLE ANTIBODY SCREEN: Antibody Screen: NEGATIVE

## 2019-08-23 LAB — OB RESULTS CONSOLE HEPATITIS B SURFACE ANTIGEN: Hepatitis B Surface Ag: NEGATIVE

## 2019-09-21 DIAGNOSIS — Z3482 Encounter for supervision of other normal pregnancy, second trimester: Secondary | ICD-10-CM | POA: Diagnosis not present

## 2019-09-21 DIAGNOSIS — Z369 Encounter for antenatal screening, unspecified: Secondary | ICD-10-CM | POA: Diagnosis not present

## 2019-10-11 DIAGNOSIS — O99212 Obesity complicating pregnancy, second trimester: Secondary | ICD-10-CM | POA: Diagnosis not present

## 2019-10-11 DIAGNOSIS — Z363 Encounter for antenatal screening for malformations: Secondary | ICD-10-CM | POA: Diagnosis not present

## 2019-11-10 DIAGNOSIS — Z363 Encounter for antenatal screening for malformations: Secondary | ICD-10-CM | POA: Diagnosis not present

## 2019-12-15 DIAGNOSIS — Z3480 Encounter for supervision of other normal pregnancy, unspecified trimester: Secondary | ICD-10-CM | POA: Diagnosis not present

## 2019-12-15 DIAGNOSIS — Z23 Encounter for immunization: Secondary | ICD-10-CM | POA: Diagnosis not present

## 2020-01-11 DIAGNOSIS — Z369 Encounter for antenatal screening, unspecified: Secondary | ICD-10-CM | POA: Diagnosis not present

## 2020-01-25 DIAGNOSIS — O26843 Uterine size-date discrepancy, third trimester: Secondary | ICD-10-CM | POA: Diagnosis not present

## 2020-01-30 DIAGNOSIS — Z3482 Encounter for supervision of other normal pregnancy, second trimester: Secondary | ICD-10-CM | POA: Diagnosis not present

## 2020-01-30 DIAGNOSIS — Z3483 Encounter for supervision of other normal pregnancy, third trimester: Secondary | ICD-10-CM | POA: Diagnosis not present

## 2020-02-03 DIAGNOSIS — O365999 Maternal care for other known or suspected poor fetal growth, unspecified trimester, other fetus: Secondary | ICD-10-CM | POA: Diagnosis not present

## 2020-02-06 ENCOUNTER — Other Ambulatory Visit: Payer: Self-pay

## 2020-02-06 ENCOUNTER — Inpatient Hospital Stay: Payer: 59 | Attending: Oncology

## 2020-02-06 DIAGNOSIS — Z23 Encounter for immunization: Secondary | ICD-10-CM | POA: Diagnosis not present

## 2020-02-10 DIAGNOSIS — Z369 Encounter for antenatal screening, unspecified: Secondary | ICD-10-CM | POA: Diagnosis not present

## 2020-02-10 DIAGNOSIS — O365939 Maternal care for other known or suspected poor fetal growth, third trimester, other fetus: Secondary | ICD-10-CM | POA: Diagnosis not present

## 2020-02-10 DIAGNOSIS — Z3480 Encounter for supervision of other normal pregnancy, unspecified trimester: Secondary | ICD-10-CM | POA: Diagnosis not present

## 2020-02-10 LAB — OB RESULTS CONSOLE GBS: GBS: POSITIVE

## 2020-02-14 DIAGNOSIS — O365931 Maternal care for other known or suspected poor fetal growth, third trimester, fetus 1: Secondary | ICD-10-CM | POA: Diagnosis not present

## 2020-02-15 ENCOUNTER — Inpatient Hospital Stay (HOSPITAL_COMMUNITY)
Admission: AD | Admit: 2020-02-15 | Discharge: 2020-02-15 | Disposition: A | Discharge status: Home / Self Care | Payer: 59 | Attending: Obstetrics and Gynecology | Admitting: Obstetrics and Gynecology

## 2020-02-15 ENCOUNTER — Encounter (HOSPITAL_COMMUNITY): Payer: Self-pay | Admitting: Obstetrics and Gynecology

## 2020-02-15 ENCOUNTER — Other Ambulatory Visit: Payer: Self-pay

## 2020-02-15 DIAGNOSIS — R252 Cramp and spasm: Secondary | ICD-10-CM | POA: Diagnosis not present

## 2020-02-15 DIAGNOSIS — Z3A37 37 weeks gestation of pregnancy: Secondary | ICD-10-CM | POA: Diagnosis not present

## 2020-02-15 DIAGNOSIS — O26893 Other specified pregnancy related conditions, third trimester: Secondary | ICD-10-CM | POA: Insufficient documentation

## 2020-02-15 DIAGNOSIS — Z3689 Encounter for other specified antenatal screening: Secondary | ICD-10-CM | POA: Diagnosis not present

## 2020-02-15 DIAGNOSIS — O10913 Unspecified pre-existing hypertension complicating pregnancy, third trimester: Secondary | ICD-10-CM | POA: Diagnosis not present

## 2020-02-15 DIAGNOSIS — N898 Other specified noninflammatory disorders of vagina: Secondary | ICD-10-CM

## 2020-02-15 DIAGNOSIS — O99891 Other specified diseases and conditions complicating pregnancy: Secondary | ICD-10-CM

## 2020-02-15 DIAGNOSIS — Z7982 Long term (current) use of aspirin: Secondary | ICD-10-CM | POA: Diagnosis not present

## 2020-02-15 LAB — POCT FERN TEST
POCT Fern Test: NEGATIVE
POCT Fern Test: NEGATIVE

## 2020-02-15 NOTE — MAU Note (Signed)
Pt reports brownish watery discharge this morning. C/o Small amount of pelvic pressure and cramping

## 2020-02-15 NOTE — MAU Provider Note (Signed)
History   [id number]   Chief Complaint  Patient presents with  . Rupture of Membranes    HPI Megan Moss is a 34 y.o. female  G1P0000 @37 .0 wks here with report of leaking watery brown fluid, happened twice this am. Leaking of fluid has not continued. Pt reports no contractions. She denies vaginal bleeding. Last intercourse was not recent. She had a cervical exam yesterday in the office. She reports good fetal movement. All other systems negative.    Patient's last menstrual period was 05/08/2019.  OB History  Gravida Para Term Preterm AB Living  1 0 0 0 0 0  SAB TAB Ectopic Multiple Live Births  0 0 0 0      # Outcome Date GA Lbr Len/2nd Weight Sex Delivery Anes PTL Lv  1 Current             Past Medical History:  Diagnosis Date  . Hypertension     Family History  Problem Relation Age of Onset  . Hypertension Mother   . Heart murmur Mother 84  . Hypertension Father   . Diabetes Maternal Grandmother   . Hypertension Maternal Grandmother   . Diabetes Maternal Grandfather   . Cancer Maternal Grandfather        prostate cancer  . Hypertension Maternal Grandfather   . Diabetes Paternal Grandmother   . Hypertension Paternal Grandmother   . Diabetes Paternal Grandfather   . Hypertension Paternal Grandfather     Social History   Socioeconomic History  . Marital status: Single    Spouse name: Not on file  . Number of children: Not on file  . Years of education: Not on file  . Highest education level: Not on file  Occupational History  . Not on file  Tobacco Use  . Smoking status: Never Smoker  . Smokeless tobacco: Never Used  Vaping Use  . Vaping Use: Never used  Substance and Sexual Activity  . Alcohol use: Yes    Alcohol/week: 0.0 standard drinks  . Drug use: No  . Sexual activity: Yes    Birth control/protection: None  Other Topics Concern  . Not on file  Social History Narrative  . Not on file   Social Determinants of Health    Financial Resource Strain:   . Difficulty of Paying Living Expenses: Not on file  Food Insecurity:   . Worried About 31 in the Last Year: Not on file  . Ran Out of Food in the Last Year: Not on file  Transportation Needs:   . Lack of Transportation (Medical): Not on file  . Lack of Transportation (Non-Medical): Not on file  Physical Activity:   . Days of Exercise per Week: Not on file  . Minutes of Exercise per Session: Not on file  Stress:   . Feeling of Stress : Not on file  Social Connections:   . Frequency of Communication with Friends and Family: Not on file  . Frequency of Social Gatherings with Friends and Family: Not on file  . Attends Religious Services: Not on file  . Active Member of Clubs or Organizations: Not on file  . Attends Programme researcher, broadcasting/film/video Meetings: Not on file  . Marital Status: Not on file    No Known Allergies  No current facility-administered medications on file prior to encounter.   Current Outpatient Medications on File Prior to Encounter  Medication Sig Dispense Refill  . aspirin EC 81 MG tablet Take 81 mg  by mouth daily. Swallow whole.    . Prenatal Vit-Fe Fumarate-FA (MULTIVITAMIN-PRENATAL) 27-0.8 MG TABS tablet Take 1 tablet by mouth daily at 12 noon.    Marland Kitchen terconazole (TERAZOL 3) 80 MG vaginal suppository Place 1 suppository (80 mg total) vaginally at bedtime. 3 suppository 0     Review of Systems  Gastrointestinal: Negative for abdominal pain.  Genitourinary: Positive for vaginal discharge. Negative for vaginal bleeding.     Physical Exam   Vitals:   02/15/20 0757  BP: 125/71  Pulse: 88  Resp: 18  Temp: 98.8 F (37.1 C)  Weight: 92.1 kg  Height: 5\' 6"  (1.676 m)    Physical Exam Vitals and nursing note reviewed. Exam conducted with a chaperone present.  Constitutional:      General: She is not in acute distress.    Appearance: Normal appearance.  HENT:     Head: Normocephalic and atraumatic.   Cardiovascular:     Rate and Rhythm: Normal rate.  Pulmonary:     Effort: Pulmonary effort is normal. No respiratory distress.  Genitourinary:    Comments: SSE: no pool, fern neg;thin brown discharge Musculoskeletal:        General: Normal range of motion.     Cervical back: Normal range of motion.  Skin:    General: Skin is warm and dry.  Neurological:     General: No focal deficit present.     Mental Status: She is alert.  Psychiatric:        Mood and Affect: Mood normal.   EFM: 140 bpm, mod variability, + accels, no decels Toco: rare  Results for orders placed or performed during the hospital encounter of 02/15/20 (from the past 24 hour(s))  POCT fern test     Status: None   Collection Time: 02/15/20  8:53 AM  Result Value Ref Range   POCT Fern Test Negative = intact amniotic membranes    MAU Course  Procedures  MDM Labs ordered and reviewed. No signs of SROM. Discharge likely from recent cervical exam. Stable for discharge home.   Assessment and Plan   1. [redacted] weeks gestation of pregnancy   2. NST (non-stress test) reactive   3. Vaginal discharge during pregnancy in third trimester    Discharge home [facility] as scheduled Labor precautions  Allergies as of 02/15/2020   No Known Allergies     Medication List    STOP taking these medications   terconazole 80 MG vaginal suppository Commonly known as: TERAZOL 3     TAKE these medications   aspirin EC 81 MG tablet Take 81 mg by mouth daily. Swallow whole.   multivitamin-prenatal 27-0.8 MG Tabs tablet Take 1 tablet by mouth daily at 12 noon.       04/16/2020, CNM 02/15/2020 8:54 AM

## 2020-02-15 NOTE — Discharge Instructions (Signed)
Braxton Hicks Contractions °Contractions of the uterus can occur throughout pregnancy, but they are not always a sign that you are in labor. You may have practice contractions called Braxton Hicks contractions. These false labor contractions are sometimes confused with true labor. °What are Braxton Hicks contractions? °Braxton Hicks contractions are tightening movements that occur in the muscles of the uterus before labor. Unlike true labor contractions, these contractions do not result in opening (dilation) and thinning of the cervix. Toward the end of pregnancy (32-34 weeks), Braxton Hicks contractions can happen more often and may become stronger. These contractions are sometimes difficult to tell apart from true labor because they can be very uncomfortable. You should not feel embarrassed if you go to the hospital with false labor. °Sometimes, the only way to tell if you are in true labor is for your health care provider to look for changes in the cervix. The health care provider will do a physical exam and may monitor your contractions. If you are not in true labor, the exam should show that your cervix is not dilating and your water has not broken. °If there are no other health problems associated with your pregnancy, it is completely safe for you to be sent home with false labor. You may continue to have Braxton Hicks contractions until you go into true labor. °How to tell the difference between true labor and false labor °True labor °· Contractions last 30-70 seconds. °· Contractions become very regular. °· Discomfort is usually felt in the top of the uterus, and it spreads to the lower abdomen and low back. °· Contractions do not go away with walking. °· Contractions usually become more intense and increase in frequency. °· The cervix dilates and gets thinner. °False labor °· Contractions are usually shorter and not as strong as true labor contractions. °· Contractions are usually irregular. °· Contractions  are often felt in the front of the lower abdomen and in the groin. °· Contractions may go away when you walk around or change positions while lying down. °· Contractions get weaker and are shorter-lasting as time goes on. °· The cervix usually does not dilate or become thin. °Follow these instructions at home: ° °· Take over-the-counter and prescription medicines only as told by your health care provider. °· Keep up with your usual exercises and follow other instructions from your health care provider. °· Eat and drink lightly if you think you are going into labor. °· If Braxton Hicks contractions are making you uncomfortable: °? Change your position from lying down or resting to walking, or change from walking to resting. °? Sit and rest in a tub of warm water. °? Drink enough fluid to keep your urine pale yellow. Dehydration may cause these contractions. °? Do slow and deep breathing several times an hour. °· Keep all follow-up prenatal visits as told by your health care provider. This is important. °Contact a health care provider if: °· You have a fever. °· You have continuous pain in your abdomen. °Get help right away if: °· Your contractions become stronger, more regular, and closer together. °· You have fluid leaking or gushing from your vagina. °· You pass blood-tinged mucus (bloody show). °· You have bleeding from your vagina. °· You have low back pain that you never had before. °· You feel your baby’s head pushing down and causing pelvic pressure. °· Your baby is not moving inside you as much as it used to. °Summary °· Contractions that occur before labor are   called Braxton Hicks contractions, false labor, or practice contractions. °· Braxton Hicks contractions are usually shorter, weaker, farther apart, and less regular than true labor contractions. True labor contractions usually become progressively stronger and regular, and they become more frequent. °· Manage discomfort from Braxton Hicks contractions  by changing position, resting in a warm bath, drinking plenty of water, or practicing deep breathing. °This information is not intended to replace advice given to you by your health care provider. Make sure you discuss any questions you have with your health care provider. °Document Revised: 05/08/2017 Document Reviewed: 10/09/2016 °Elsevier Patient Education © 2020 [employer] Inc[employer] ° °

## 2020-02-21 DIAGNOSIS — R102 Pelvic and perineal pain: Secondary | ICD-10-CM | POA: Diagnosis not present

## 2020-02-23 DIAGNOSIS — O365939 Maternal care for other known or suspected poor fetal growth, third trimester, other fetus: Secondary | ICD-10-CM | POA: Diagnosis not present

## 2020-02-23 DIAGNOSIS — O368139 Decreased fetal movements, third trimester, other fetus: Secondary | ICD-10-CM | POA: Diagnosis not present

## 2020-02-23 DIAGNOSIS — O26843 Uterine size-date discrepancy, third trimester: Secondary | ICD-10-CM | POA: Diagnosis not present

## 2020-02-24 ENCOUNTER — Encounter (HOSPITAL_COMMUNITY): Payer: Self-pay | Admitting: Obstetrics and Gynecology

## 2020-02-24 ENCOUNTER — Inpatient Hospital Stay (EMERGENCY_DEPARTMENT_HOSPITAL)
Admission: AD | Admit: 2020-02-24 | Discharge: 2020-02-25 | Disposition: A | Discharge status: Home / Self Care | Payer: 59 | Source: Home / Self Care | Attending: Obstetrics and Gynecology | Admitting: Obstetrics and Gynecology

## 2020-02-24 ENCOUNTER — Telehealth (HOSPITAL_COMMUNITY): Payer: Self-pay | Admitting: *Deleted

## 2020-02-24 ENCOUNTER — Other Ambulatory Visit: Payer: Self-pay

## 2020-02-24 DIAGNOSIS — O471 False labor at or after 37 completed weeks of gestation: Secondary | ICD-10-CM | POA: Insufficient documentation

## 2020-02-24 DIAGNOSIS — Z3A38 38 weeks gestation of pregnancy: Secondary | ICD-10-CM | POA: Insufficient documentation

## 2020-02-24 DIAGNOSIS — Z20822 Contact with and (suspected) exposure to covid-19: Secondary | ICD-10-CM | POA: Diagnosis not present

## 2020-02-24 DIAGNOSIS — O99824 Streptococcus B carrier state complicating childbirth: Secondary | ICD-10-CM | POA: Diagnosis not present

## 2020-02-24 DIAGNOSIS — Z23 Encounter for immunization: Secondary | ICD-10-CM | POA: Diagnosis not present

## 2020-02-24 DIAGNOSIS — O479 False labor, unspecified: Secondary | ICD-10-CM

## 2020-02-24 MED ORDER — ZOLPIDEM TARTRATE 5 MG PO TABS: 5.0000 mg | ORAL_TABLET | Freq: Every evening | ORAL | 0 refills | Status: AC | PRN

## 2020-02-24 MED ORDER — ZOLPIDEM TARTRATE 5 MG PO TABS
5.0000 mg | ORAL_TABLET | Freq: Every evening | ORAL | Status: DC | PRN
  Administered 2020-02-24: 5 mg via ORAL
  Filled 2020-02-24: qty 1

## 2020-02-24 NOTE — MAU Note (Signed)
Pt reports contractions since last night, worsening. ? Mucous discharge at home. Reports good fetal movement.

## 2020-02-24 NOTE — Discharge Instructions (Signed)
Braxton Hicks Contractions Contractions of the uterus can occur throughout pregnancy, but they are not always a sign that you are in labor. You may have practice contractions called Braxton Hicks contractions. These false labor contractions are sometimes confused with true labor. What are Braxton Hicks contractions? Braxton Hicks contractions are tightening movements that occur in the muscles of the uterus before labor. Unlike true labor contractions, these contractions do not result in opening (dilation) and thinning of the cervix. Toward the end of pregnancy (32-34 weeks), Braxton Hicks contractions can happen more often and may become stronger. These contractions are sometimes difficult to tell apart from true labor because they can be very uncomfortable. You should not feel embarrassed if you go to the hospital with false labor. Sometimes, the only way to tell if you are in true labor is for your health care provider to look for changes in the cervix. The health care provider will do a physical exam and may monitor your contractions. If you are not in true labor, the exam should show that your cervix is not dilating and your water has not broken. If there are no other health problems associated with your pregnancy, it is completely safe for you to be sent home with false labor. You may continue to have Braxton Hicks contractions until you go into true labor. How to tell the difference between true labor and false labor True labor  Contractions last 30-70 seconds.  Contractions become very regular.  Discomfort is usually felt in the top of the uterus, and it spreads to the lower abdomen and low back.  Contractions do not go away with walking.  Contractions usually become more intense and increase in frequency.  The cervix dilates and gets thinner. False labor  Contractions are usually shorter and not as strong as true labor contractions.  Contractions are usually irregular.  Contractions  are often felt in the front of the lower abdomen and in the groin.  Contractions may go away when you walk around or change positions while lying down.  Contractions get weaker and are shorter-lasting as time goes on.  The cervix usually does not dilate or become thin. Follow these instructions at home:   Take over-the-counter and prescription medicines only as told by your health care provider.  Keep up with your usual exercises and follow other instructions from your health care provider.  Eat and drink lightly if you think you are going into labor.  If Braxton Hicks contractions are making you uncomfortable: ? Change your position from lying down or resting to walking, or change from walking to resting. ? Sit and rest in a tub of warm water. ? Drink enough fluid to keep your urine pale yellow. Dehydration may cause these contractions. ? Do slow and deep breathing several times an hour.  Keep all follow-up prenatal visits as told by your health care provider. This is important. Contact a health care provider if:  You have a fever.  You have continuous pain in your abdomen. Get help right away if:  Your contractions become stronger, more regular, and closer together.  You have fluid leaking or gushing from your vagina.  You pass blood-tinged mucus (bloody show).  You have bleeding from your vagina.  You have low back pain that you never had before.  You feel your baby's head pushing down and causing pelvic pressure.  Your baby is not moving inside you as much as it used to. Summary  Contractions that occur before labor are   called Braxton Hicks contractions, false labor, or practice contractions.  Braxton Hicks contractions are usually shorter, weaker, farther apart, and less regular than true labor contractions. True labor contractions usually become progressively stronger and regular, and they become more frequent.  Manage discomfort from Braxton Hicks contractions  by changing position, resting in a warm bath, drinking plenty of water, or practicing deep breathing. This information is not intended to replace advice given to you by your health care provider. Make sure you discuss any questions you have with your health care provider. Document Revised: 05/08/2017 Document Reviewed: 10/09/2016 Elsevier Patient Education  2020 [employer]  First Stage of Labor Labor is your body's natural process of moving your baby and other structures, including the placenta and umbilical cord, out of your uterus. There are three stages of labor. How long each stage lasts is different for every woman. But certain events happen during each stage that are the same for everyone.  The first stage starts when true labor begins. This stage ends when your cervix, which is the opening from your uterus into your vagina, is completely open (dilated).  The second stage begins when your cervix is fully dilated and you start pushing. This stage ends when your baby is born.  The third stage is the delivery of the organ that nourished your baby during pregnancy (placenta). First stage of labor As your due date gets closer, you may start to notice certain physical changes that mean labor is going to start soon. You may feel that your baby has dropped lower into your pelvis. You may experience irregular, often painless, contractions that go away when you walk around or lie down (Braxton Hicks contractions). This is also called false labor. The first stage of labor begins when you start having contractions that come at regular (evenly spaced) intervals and your cervix starts to get thinner and wider in preparation for your baby to pass through. Birth care providers measure the dilation of your cervix in centimeters (cm). One centimeter is a little less than one-half of an inch. The first stage ends when your cervix is dilated to 10 cm. The first stage of labor is divided into three  phases:  Early phase.  Active phase.  Transitional phase. The length of the first stage of labor varies. It may be longer if this is your first pregnancy. You may spend most of this stage at home trying to relax and stay comfortable. How does this affect me? During the first stage of labor, you will move through three phases. What happens in the early phase?  You will start to have regular contractions that last 30-60 seconds. Contractions may come every 5-20 minutes. Keep track of your contractions and call your birth care provider.  Your water may break during this phase.  You may notice a clear or slightly bloody discharge of mucus (mucus plug) from your vagina.  Your cervix will dilate to 3-6 cm. What happens in the active phase? The active phase usually lasts 3-5 hours. You may go to the hospital or birth center around this time. During the active phase:  Your contractions will become stronger, longer, and more uncomfortable.  Your contractions may last 45-90 seconds and come every 3-5 minutes.  You may feel lower back pain.  Your birth care providers may examine your cervix and feel your belly to find the position of your baby.  You may have a monitor strapped to your belly to measure your contractions and your baby's   heart rate.  You may start using your pain management options.  Your cervix may be dilated to 6 cm and may start to dilate more quickly. What happens in the transitional phase? The transitional phase typically lasts from 30 minutes to 2 hours. At the end of this phase, your cervix will be fully dilated to 10 cm. During the transitional phase:  Contractions will get stronger and longer.  Contractions may last 60-90 seconds and come less than 2 minutes apart.  You may feel hot flashes, chills, or nausea. How does this affect my baby? During the first stage of labor, your baby will gradually move down into your birth canal. Follow these instructions at  home and in the hospital or birth center:   When labor first begins, try to stay calm. You are still in the early phase. If it is night, try to get some sleep. If it is day, try to relax and save your energy. You may want to make some calls and get ready to go to the hospital or birth center.  When you are in the early phase, try these methods to help ease discomfort: ? Deep breathing and muscle relaxation. ? Taking a walk. ? Taking a warm bath or shower.  Drink some fluids and have a light snack if you feel like it.  Keep track of your contractions.  Based on the plan you created with your birth care provider, call when your contractions indicate it is time.  If your water breaks, note the time, color, and odor of the fluid.  When you are in the active phase, do your breathing exercises and rely on your support people and your team of birth care providers. Contact a health care provider if:  Your contractions are strong and regular.  You have lower back pain or cramping.  Your water breaks.  You lose your mucus plug. Get help right away if you:  Have a severe headache that does not go away.  Have changes in your vision.  Have severe pain in your upper belly.  Do not feel the baby move.  Have bright red bleeding. Summary  The first stage of labor starts when true labor begins, and it ends when your cervix is dilated to 10 cm.  The first stage of labor has three phases: early, active, and transitional.  Your baby moves into the birth canal during the first stage of labor.  You may have contractions that become stronger and longer. You may also lose your mucus plug and have your water break.  Call your birth care provider when your contractions are frequent and strong enough to go to the hospital or birth center. This information is not intended to replace advice given to you by your health care provider. Make sure you discuss any questions you have with your health  care provider. Document Revised: 09/16/2018 Document Reviewed: 08/09/2017 Elsevier Patient Education  2020 Else[employer] Inc.  

## 2020-02-24 NOTE — MAU Provider Note (Signed)
None    S: Ms. LICET DUNPHY is a 34 y.o. G1P0000 at [redacted]w[redacted]d  who presents to MAU today complaining of contractions since last night.  She denies vaginal bleeding. She denies LOF. She reports normal fetal movement.    She receives care with [facility]  O: BP 127/73 (BP Location: Right Arm)   Pulse 98   Temp 98.3 F (36.8 C) (Oral)   Resp 18   Ht 5\' 6"  (1.676 m)   Wt 92.1 kg   LMP 05/08/2019   SpO2 98% Comment: ROOM AIR   BMI 32.77 kg/m  GENERAL: Well-developed, well-nourished female in no acute distress.  HEAD: Normocephalic, atraumatic.  CHEST: Normal effort of breathing, regular heart rate ABDOMEN: Soft, nontender, gravid  Cervical exam:  Dilation: 1.5 Effacement (%): 50 Cervical Position: Posterior Station: -2 Presentation: Vertex Exam by:: [email or web address] RN    Fetal Monitoring: Baseline: 135 Variability: Mod Accelerations: 15 x 15 Decelerations: None Contractions: Occasional   A: SIUP at [redacted]w[redacted]d  Cat I tracing Normotensive No cervical change in 90 minutes of monitoring in MAU  False labor   Meds ordered this encounter  Medications  . zolpidem (AMBIEN) 5 MG tablet    Sig: Take 1 tablet (5 mg total) by mouth at bedtime as needed for sleep.    Dispense:  5 tablet    Refill:  0    Order Specific Question:   Supervising Provider    Answer:   EURE, LUTHER H [[id number]]  . zolpidem (AMBIEN) tablet 5 mg   P: Discharge home in stable condition with labor precautions Outpatient rx Ambien per patient request  [redacted]w[redacted]d, CNM 02/25/2020 12:10 AM

## 2020-02-24 NOTE — Telephone Encounter (Signed)
Preadmission screen  

## 2020-02-25 DIAGNOSIS — O471 False labor at or after 37 completed weeks of gestation: Secondary | ICD-10-CM | POA: Diagnosis not present

## 2020-02-25 DIAGNOSIS — Z3A38 38 weeks gestation of pregnancy: Secondary | ICD-10-CM

## 2020-02-25 NOTE — Progress Notes (Signed)
Discharge information given to patient, instructed to return to MAU if contractions became more regular, VB, LOF, decreased FM. Ambien prescription given as well as Ambien 5 mg po before discharge. Patient waited 20 minutes after po medication before leaving ambulatory. Patient verbalizes understanding.

## 2020-02-26 ENCOUNTER — Encounter (HOSPITAL_COMMUNITY): Payer: Self-pay | Admitting: Obstetrics and Gynecology

## 2020-02-26 ENCOUNTER — Inpatient Hospital Stay (EMERGENCY_DEPARTMENT_HOSPITAL)
Admission: AD | Admit: 2020-02-26 | Discharge: 2020-02-27 | Disposition: A | Discharge status: Home / Self Care | Payer: 59 | Source: Ambulatory Visit | Attending: Obstetrics and Gynecology | Admitting: Obstetrics and Gynecology

## 2020-02-26 ENCOUNTER — Other Ambulatory Visit: Payer: Self-pay

## 2020-02-26 DIAGNOSIS — O471 False labor at or after 37 completed weeks of gestation: Secondary | ICD-10-CM | POA: Insufficient documentation

## 2020-02-26 DIAGNOSIS — N949 Unspecified condition associated with female genital organs and menstrual cycle: Secondary | ICD-10-CM

## 2020-02-26 DIAGNOSIS — O479 False labor, unspecified: Secondary | ICD-10-CM

## 2020-02-26 DIAGNOSIS — Z3A38 38 weeks gestation of pregnancy: Secondary | ICD-10-CM | POA: Insufficient documentation

## 2020-02-26 MED ORDER — BUTORPHANOL TARTRATE 1 MG/ML IJ SOLN
1.0000 mg | Freq: Once | INTRAMUSCULAR | Status: AC
  Administered 2020-02-26: 1 mg via INTRAMUSCULAR
  Filled 2020-02-26: qty 1

## 2020-02-26 MED ORDER — PROMETHAZINE HCL 25 MG/ML IJ SOLN: 12.5000 mg | Freq: Once | INTRAMUSCULAR | Status: DC

## 2020-02-26 MED ORDER — BUTORPHANOL TARTRATE 1 MG/ML IJ SOLN
1.0000 mg | Freq: Once | INTRAMUSCULAR | Status: DC
Start: 2020-02-26 — End: 2020-02-26

## 2020-02-26 MED ORDER — ONDANSETRON 4 MG PO TBDP
8.0000 mg | ORAL_TABLET | Freq: Once | ORAL | Status: AC
  Administered 2020-02-26: 8 mg via ORAL
  Filled 2020-02-26: qty 2

## 2020-02-26 MED ORDER — PROMETHAZINE HCL 25 MG/ML IJ SOLN
12.5000 mg | Freq: Once | INTRAMUSCULAR | Status: AC
  Administered 2020-02-26: 12.5 mg via INTRAMUSCULAR
  Filled 2020-02-26: qty 1

## 2020-02-26 NOTE — MAU Note (Signed)
Pt reports that she has not been able to eat since Friday and not able to drink due vomiting. Pt reports CTX. Endorses FM. Denies vaginal bleeding or LOF.  2058 SVE: vertex, 3cm

## 2020-02-27 ENCOUNTER — Encounter (HOSPITAL_COMMUNITY): Payer: Self-pay | Admitting: Obstetrics and Gynecology

## 2020-02-27 ENCOUNTER — Inpatient Hospital Stay: Payer: 59 | Attending: Oncology

## 2020-02-27 ENCOUNTER — Inpatient Hospital Stay (HOSPITAL_COMMUNITY)
Admission: AD | Admit: 2020-02-27 | Discharge: 2020-02-29 | DRG: 768 | Disposition: A | Discharge status: Home / Self Care | Payer: 59 | Attending: Obstetrics and Gynecology | Admitting: Obstetrics and Gynecology

## 2020-02-27 DIAGNOSIS — O471 False labor at or after 37 completed weeks of gestation: Secondary | ICD-10-CM | POA: Diagnosis not present

## 2020-02-27 DIAGNOSIS — Z3A38 38 weeks gestation of pregnancy: Secondary | ICD-10-CM | POA: Diagnosis not present

## 2020-02-27 DIAGNOSIS — Z23 Encounter for immunization: Secondary | ICD-10-CM

## 2020-02-27 DIAGNOSIS — Z20822 Contact with and (suspected) exposure to covid-19: Secondary | ICD-10-CM | POA: Diagnosis present

## 2020-02-27 DIAGNOSIS — O26893 Other specified pregnancy related conditions, third trimester: Secondary | ICD-10-CM | POA: Diagnosis present

## 2020-02-27 DIAGNOSIS — O99824 Streptococcus B carrier state complicating childbirth: Secondary | ICD-10-CM | POA: Diagnosis present

## 2020-02-27 HISTORY — DX: Other specified health status: Z78.9

## 2020-02-27 LAB — CBC
HCT: 42.4 % (ref 36.0–46.0)
Hemoglobin: 13.9 g/dL (ref 12.0–15.0)
MCH: 29.1 pg (ref 26.0–34.0)
MCHC: 32.8 g/dL (ref 30.0–36.0)
MCV: 88.9 fL (ref 80.0–100.0)
Platelets: 287 10*3/uL (ref 150–400)
RBC: 4.77 MIL/uL (ref 3.87–5.11)
RDW: 15.7 % — ABNORMAL HIGH (ref 11.5–15.5)
WBC: 17.3 10*3/uL — ABNORMAL HIGH (ref 4.0–10.5)
nRBC: 0 % (ref 0.0–0.2)

## 2020-02-27 LAB — TYPE AND SCREEN
ABO/RH(D): O POS
Antibody Screen: NEGATIVE

## 2020-02-27 LAB — SARS CORONAVIRUS 2 BY RT PCR (HOSPITAL ORDER, PERFORMED IN ~~LOC~~ HOSPITAL LAB): SARS Coronavirus 2: NEGATIVE

## 2020-02-27 LAB — RPR: RPR Ser Ql: NONREACTIVE

## 2020-02-27 MED ORDER — DIBUCAINE (PERIANAL) 1 % EX OINT: 1.0000 "application " | TOPICAL_OINTMENT | CUTANEOUS | Status: DC | PRN

## 2020-02-27 MED ORDER — ZOLPIDEM TARTRATE 5 MG PO TABS: 5.0000 mg | ORAL_TABLET | Freq: Every evening | ORAL | Status: DC | PRN

## 2020-02-27 MED ORDER — FENTANYL CITRATE (PF) 100 MCG/2ML IJ SOLN: 50.0000 ug | INTRAMUSCULAR | Status: DC | PRN

## 2020-02-27 MED ORDER — OXYCODONE-ACETAMINOPHEN 5-325 MG PO TABS: 2.0000 | ORAL_TABLET | ORAL | Status: DC | PRN

## 2020-02-27 MED ORDER — MISOPROSTOL 200 MCG PO TABS
ORAL_TABLET | ORAL | Status: AC
  Filled 2020-02-27: qty 5

## 2020-02-27 MED ORDER — TETANUS-DIPHTH-ACELL PERTUSSIS 5-2.5-18.5 LF-MCG/0.5 IM SUSP: 0.5000 mL | Freq: Once | INTRAMUSCULAR | Status: DC

## 2020-02-27 MED ORDER — OXYCODONE-ACETAMINOPHEN 5-325 MG PO TABS: 1.0000 | ORAL_TABLET | ORAL | Status: DC | PRN

## 2020-02-27 MED ORDER — OXYTOCIN BOLUS FROM INFUSION: 333.0000 mL | Freq: Once | INTRAVENOUS | Status: AC

## 2020-02-27 MED ORDER — METHYLERGONOVINE MALEATE 0.2 MG PO TABS: 0.2000 mg | ORAL_TABLET | ORAL | Status: DC | PRN

## 2020-02-27 MED ORDER — MISOPROSTOL 200 MCG PO TABS
1000.0000 ug | ORAL_TABLET | Freq: Once | ORAL | Status: AC
  Administered 2020-02-27: 1000 ug via RECTAL

## 2020-02-27 MED ORDER — ONDANSETRON HCL 4 MG/2ML IJ SOLN: 4.0000 mg | INTRAMUSCULAR | Status: DC | PRN

## 2020-02-27 MED ORDER — OXYCODONE HCL 5 MG PO TABS: 5.0000 mg | ORAL_TABLET | ORAL | Status: DC | PRN

## 2020-02-27 MED ORDER — PRENATAL MULTIVITAMIN CH
1.0000 | ORAL_TABLET | Freq: Every day | ORAL | Status: DC
  Administered 2020-02-27 – 2020-02-28 (×2): 1 via ORAL
  Filled 2020-02-27 (×2): qty 1

## 2020-02-27 MED ORDER — ONDANSETRON 8 MG PO TBDP: 8.0000 mg | ORAL_TABLET | Freq: Three times a day (TID) | ORAL | 0 refills | Status: AC | PRN

## 2020-02-27 MED ORDER — CYCLOBENZAPRINE HCL 10 MG PO TABS: 10.0000 mg | ORAL_TABLET | Freq: Two times a day (BID) | ORAL | 0 refills | Status: AC | PRN

## 2020-02-27 MED ORDER — FLEET ENEMA 7-19 GM/118ML RE ENEM: 1.0000 | ENEMA | RECTAL | Status: DC | PRN

## 2020-02-27 MED ORDER — METHYLERGONOVINE MALEATE 0.2 MG/ML IJ SOLN
INTRAMUSCULAR | Status: AC
  Filled 2020-02-27: qty 1

## 2020-02-27 MED ORDER — OXYTOCIN-SODIUM CHLORIDE 30-0.9 UT/500ML-% IV SOLN
INTRAVENOUS | Status: AC
  Administered 2020-02-27: 333 mL via INTRAVENOUS
  Filled 2020-02-27: qty 500

## 2020-02-27 MED ORDER — WITCH HAZEL-GLYCERIN EX PADS
1.0000 "application " | MEDICATED_PAD | CUTANEOUS | Status: DC | PRN
  Administered 2020-02-28: 1 via TOPICAL

## 2020-02-27 MED ORDER — ACETAMINOPHEN 325 MG PO TABS: 650.0000 mg | ORAL_TABLET | ORAL | Status: DC | PRN

## 2020-02-27 MED ORDER — SENNOSIDES-DOCUSATE SODIUM 8.6-50 MG PO TABS
2.0000 | ORAL_TABLET | ORAL | Status: DC
  Administered 2020-02-27 – 2020-02-28 (×2): 2 via ORAL
  Filled 2020-02-27 (×2): qty 2

## 2020-02-27 MED ORDER — LACTATED RINGERS IV SOLN: 500.0000 mL | INTRAVENOUS | Status: DC | PRN

## 2020-02-27 MED ORDER — LACTATED RINGERS IV SOLN: INTRAVENOUS | Status: DC

## 2020-02-27 MED ORDER — COCONUT OIL OIL
1.0000 "application " | TOPICAL_OIL | Status: DC | PRN
  Administered 2020-02-28: 1 via TOPICAL

## 2020-02-27 MED ORDER — LIDOCAINE HCL (PF) 1 % IJ SOLN
30.0000 mL | INTRAMUSCULAR | Status: AC | PRN
  Administered 2020-02-27: 30 mL via SUBCUTANEOUS
  Filled 2020-02-27: qty 30

## 2020-02-27 MED ORDER — BENZOCAINE-MENTHOL 20-0.5 % EX AERO
1.0000 "application " | INHALATION_SPRAY | CUTANEOUS | Status: DC | PRN
  Administered 2020-02-28: 1 via TOPICAL
  Filled 2020-02-27: qty 56

## 2020-02-27 MED ORDER — METHYLERGONOVINE MALEATE 0.2 MG/ML IJ SOLN: 0.2000 mg | INTRAMUSCULAR | Status: DC | PRN

## 2020-02-27 MED ORDER — ONDANSETRON HCL 4 MG PO TABS: 4.0000 mg | ORAL_TABLET | ORAL | Status: DC | PRN

## 2020-02-27 MED ORDER — SIMETHICONE 80 MG PO CHEW: 80.0000 mg | CHEWABLE_TABLET | ORAL | Status: DC | PRN

## 2020-02-27 MED ORDER — TRANEXAMIC ACID-NACL 1000-0.7 MG/100ML-% IV SOLN
INTRAVENOUS | Status: AC
  Filled 2020-02-27: qty 100

## 2020-02-27 MED ORDER — METHYLERGONOVINE MALEATE 0.2 MG/ML IJ SOLN
0.2000 mg | Freq: Once | INTRAMUSCULAR | Status: AC
  Administered 2020-02-27: 0.2 mg via INTRAMUSCULAR

## 2020-02-27 MED ORDER — ONDANSETRON HCL 4 MG/2ML IJ SOLN: 4.0000 mg | Freq: Four times a day (QID) | INTRAMUSCULAR | Status: DC | PRN

## 2020-02-27 MED ORDER — SODIUM CHLORIDE 0.9 % IV SOLN
2.0000 g | Freq: Once | INTRAVENOUS | Status: AC
  Administered 2020-02-27: 2 g via INTRAVENOUS

## 2020-02-27 MED ORDER — OXYCODONE HCL 5 MG PO TABS: 10.0000 mg | ORAL_TABLET | ORAL | Status: DC | PRN

## 2020-02-27 MED ORDER — TRANEXAMIC ACID-NACL 1000-0.7 MG/100ML-% IV SOLN
1000.0000 mg | INTRAVENOUS | Status: AC
  Administered 2020-02-27: 1000 mg via INTRAVENOUS

## 2020-02-27 MED ORDER — IBUPROFEN 600 MG PO TABS
600.0000 mg | ORAL_TABLET | Freq: Four times a day (QID) | ORAL | Status: DC
  Administered 2020-02-27 – 2020-02-29 (×8): 600 mg via ORAL
  Filled 2020-02-27 (×8): qty 1

## 2020-02-27 MED ORDER — DIPHENHYDRAMINE HCL 25 MG PO CAPS: 25.0000 mg | ORAL_CAPSULE | Freq: Four times a day (QID) | ORAL | Status: DC | PRN

## 2020-02-27 MED ORDER — OXYTOCIN-SODIUM CHLORIDE 30-0.9 UT/500ML-% IV SOLN
2.5000 [IU]/h | INTRAVENOUS | Status: DC
  Administered 2020-02-27: 2.5 [IU]/h via INTRAVENOUS

## 2020-02-27 MED ORDER — SOD CITRATE-CITRIC ACID 500-334 MG/5ML PO SOLN: 30.0000 mL | ORAL | Status: DC | PRN

## 2020-02-27 NOTE — Discharge Instructions (Signed)
Braxton Hicks Contractions °Contractions of the uterus can occur throughout pregnancy, but they are not always a sign that you are in labor. You may have practice contractions called Braxton Hicks contractions. These false labor contractions are sometimes confused with true labor. °What are Braxton Hicks contractions? °Braxton Hicks contractions are tightening movements that occur in the muscles of the uterus before labor. Unlike true labor contractions, these contractions do not result in opening (dilation) and thinning of the cervix. Toward the end of pregnancy (32-34 weeks), Braxton Hicks contractions can happen more often and may become stronger. These contractions are sometimes difficult to tell apart from true labor because they can be very uncomfortable. You should not feel embarrassed if you go to the hospital with false labor. °Sometimes, the only way to tell if you are in true labor is for your health care provider to look for changes in the cervix. The health care provider will do a physical exam and may monitor your contractions. If you are not in true labor, the exam should show that your cervix is not dilating and your water has not broken. °If there are no other health problems associated with your pregnancy, it is completely safe for you to be sent home with false labor. You may continue to have Braxton Hicks contractions until you go into true labor. °How to tell the difference between true labor and false labor °True labor °· Contractions last 30-70 seconds. °· Contractions become very regular. °· Discomfort is usually felt in the top of the uterus, and it spreads to the lower abdomen and low back. °· Contractions do not go away with walking. °· Contractions usually become more intense and increase in frequency. °· The cervix dilates and gets thinner. °False labor °· Contractions are usually shorter and not as strong as true labor contractions. °· Contractions are usually irregular. °· Contractions  are often felt in the front of the lower abdomen and in the groin. °· Contractions may go away when you walk around or change positions while lying down. °· Contractions get weaker and are shorter-lasting as time goes on. °· The cervix usually does not dilate or become thin. °Follow these instructions at home: ° °· Take over-the-counter and prescription medicines only as told by your health care provider. °· Keep up with your usual exercises and follow other instructions from your health care provider. °· Eat and drink lightly if you think you are going into labor. °· If Braxton Hicks contractions are making you uncomfortable: °? Change your position from lying down or resting to walking, or change from walking to resting. °? Sit and rest in a tub of warm water. °? Drink enough fluid to keep your urine pale yellow. Dehydration may cause these contractions. °? Do slow and deep breathing several times an hour. °· Keep all follow-up prenatal visits as told by your health care provider. This is important. °Contact a health care provider if: °· You have a fever. °· You have continuous pain in your abdomen. °Get help right away if: °· Your contractions become stronger, more regular, and closer together. °· You have fluid leaking or gushing from your vagina. °· You pass blood-tinged mucus (bloody show). °· You have bleeding from your vagina. °· You have low back pain that you never had before. °· You feel your baby’s head pushing down and causing pelvic pressure. °· Your baby is not moving inside you as much as it used to. °Summary °· Contractions that occur before labor are   called Braxton Hicks contractions, false labor, or practice contractions. °· Braxton Hicks contractions are usually shorter, weaker, farther apart, and less regular than true labor contractions. True labor contractions usually become progressively stronger and regular, and they become more frequent. °· Manage discomfort from Braxton Hicks contractions  by changing position, resting in a warm bath, drinking plenty of water, or practicing deep breathing. °This information is not intended to replace advice given to you by your health care provider. Make sure you discuss any questions you have with your health care provider. °Document Revised: 05/08/2017 Document Reviewed: 10/09/2016 °Elsevier Patient Education © 2020 [employer] Inc[employer] ° °

## 2020-02-27 NOTE — MAU Provider Note (Signed)
  S: Ms. Megan Moss is a 34 y.o. G1P0000 at 108w5d  who presents to MAU today complaining contractions q 2-4 minutes today, but she reports that she has been feeling contractions and nausea/vomiting since Friday. She denies vaginal bleeding. She denies LOF. She reports normal fetal movement.  Patient's partner reports that patient has been staying in bed a lot; does not want to walk and encourage labor.    O: BP 124/71 (BP Location: Right Arm)   Pulse (!) 101   Temp 98.1 F (36.7 C) (Oral)   Resp 18   Ht 5\' 6"  (1.676 m)   Wt 89.5 kg   LMP 05/08/2019   SpO2 99%   BMI 31.85 kg/m  GENERAL: Well-developed, well-nourished female in no acute distress.  HEAD: Normocephalic, atraumatic.  CHEST: Normal effort of breathing, regular heart rate ABDOMEN: Soft, nontender, gravid  Cervical exam:  Dilation: 4.5 Effacement (%): 80 Cervical Position: Posterior Station: -3 Presentation: Vertex Exam by:: A. Sedano, RN  Cervix is unchanged while in MAU, had 3 cervical checks and cervical exam was confirmed by two different RNs, RN first exam amended to be 3-4.   Patient had stadol and phenergan injections.   Fetal Monitoring: Baseline: 135 Variability: mod var Accelerations: present Decelerations: absent Contractions: q 2-3  A: SIUP at [redacted]w[redacted]d  False labor  P: Discharge home with return precautions Reviewed 5-1-1 rule for labor, patient is comfortable appearing in chair; reviewed importance of hydration, eating and movements. Reports that pain is mostly above her pubic symphysis, not all over. Explained most likely RLP. Continue to take Zofran, RX for Flexeril given as well.    [redacted]w[redacted]d, CNM 02/27/2020 12:47 AM

## 2020-02-27 NOTE — MAU Note (Signed)
Pt reports to MAU complaining of CTX. Pt reports a fainting episode. Pt was brought directly from lobby into room in wheelchair.  SVE exam 10cm Provider Paulina Fusi, CNM was called to bedside.  Dr. Tenny Craw called to obtain admission orders.  Pt was safely transported to L&D with provider K. Crisoforo Oxford, CNM  Covid swab was collected.

## 2020-02-27 NOTE — H&P (Signed)
Megan Moss is a 34 y.o. female presenting for labor  34 yo G1P0 @ 38+5 presents for painful contractions and was found to be completely dilated. She had been seen earlier in the morning and was send home at 4-5 cm dilated. She had been seen the night before and was found to be 3 cm and sent home. The night before that she was sent home at 1-2 cm. While being transported to L&D she SROM'd for meconium stained fluid OB History    Gravida  1   Para  0   Term  0   Preterm  0   AB  0   Living  0     SAB  0   TAB  0   Ectopic  0   Multiple  0   Live Births             Past Medical History:  Diagnosis Date  . Medical history non-contributory    Past Surgical History:  Procedure Laterality Date  . BREAST FIBROADENOMA SURGERY Bilateral 2007   Family History: family history includes Cancer in her maternal grandfather; Diabetes in her maternal grandfather, maternal grandmother, paternal grandfather, and paternal grandmother; Heart murmur (age of onset: 41) in her mother; Hypertension in her father, maternal grandfather, maternal grandmother, mother, paternal grandfather, and paternal grandmother. Social History:  reports that she has never smoked. She has never used smokeless tobacco. She reports current alcohol use. She reports that she does not use drugs.     Maternal Diabetes: No Genetic Screening: Normal Maternal Ultrasounds/Referrals: Normal Fetal Ultrasounds or other Referrals:  None Maternal Substance Abuse:  No Significant Maternal Medications:  None Significant Maternal Lab Results:  GBS pos Other Comments:  None  History   Blood pressure 128/71, pulse 80, resp. rate 18, last menstrual period 05/08/2019. Exam Physical Exam  Prenatal labs: ABO, Rh: O/Positive/-- (03/16 0000) Antibody: Negative (03/16 0000) Rubella: Immune (03/16 0000) RPR: Nonreactive (03/16 0000)  HBsAg: Negative (03/16 0000)  HIV: Non-reactive (03/16 0000)  GBS: Positive/--  (09/03 0000)   Assessment/Plan: 1) admit 2) precipitous delivery   Waynard Reeds 02/27/2020, 7:58 AM

## 2020-02-27 NOTE — Lactation Note (Signed)
This note was copied from a baby's chart. Lactation Consultation Note  Patient Name: Megan Moss [id number] Date: 02/27/2020 Reason for consult: Initial assessment;Early term 37-38.6wks;Primapara;1st time breastfeeding  P1 mother whose infant is now 41 hours old.  This is an ETI at 38+5 weeks.  Baby was asleep in mother's arms when I arrived.  Reviewed breast feeding basics with mother including STS, feeding cues, hand expression and how to obtain a good latch.  Discussed positioning and the importance of asking for latch assistance as needed.    NP in room to assess baby during my visit.  Infant very active and awake after assessment.  Offered to assist mother with latching and mother agreeable.  Mother demonstrated hand expression but was unable to see colostrum at this time.  Container provided and milk storage times reviewed.  Finger feeding demonstrated.   Attempted to latch baby to the left breast in the cross cradle hold, however, baby was too sleepy.  Gentle stimulation did not arouse her enough to latch.  Placed her STS on mother's chest and she fell asleep.  Mom made aware of O/P services, breastfeeding support groups, community resources, and our phone # for post-discharge questions. Mother has a DEBP for home use.  Father present.     Maternal Data Formula Feeding for Exclusion: No Has patient been taught Hand Expression?: Yes Does the patient have breastfeeding experience prior to this delivery?: No  Feeding Feeding Type: Breast Fed  LATCH Score Latch: Too sleepy or reluctant, no latch achieved, no sucking elicited.  Audible Swallowing: None  Type of Nipple: Everted at rest and after stimulation  Comfort (Breast/Nipple): Soft / non-tender  Hold (Positioning): Assistance needed to correctly position infant at breast and maintain latch.  LATCH Score: 5  Interventions Interventions: Breast feeding basics reviewed;Assisted with latch;Skin to skin;Breast  massage;Hand express;Adjust position;Position options;Support pillows  Lactation Tools Discussed/Used     Consult Status Consult Status: Follow-up Date: 02/28/20 Follow-up type: In-patient    Megan Moss 02/27/2020, 12:20 PM

## 2020-02-28 LAB — CBC
HCT: 35.5 % — ABNORMAL LOW (ref 36.0–46.0)
Hemoglobin: 11.5 g/dL — ABNORMAL LOW (ref 12.0–15.0)
MCH: 28.6 pg (ref 26.0–34.0)
MCHC: 32.4 g/dL (ref 30.0–36.0)
MCV: 88.3 fL (ref 80.0–100.0)
Platelets: 257 10*3/uL (ref 150–400)
RBC: 4.02 MIL/uL (ref 3.87–5.11)
RDW: 15.6 % — ABNORMAL HIGH (ref 11.5–15.5)
WBC: 12.5 10*3/uL — ABNORMAL HIGH (ref 4.0–10.5)
nRBC: 0 % (ref 0.0–0.2)

## 2020-02-28 NOTE — Progress Notes (Signed)
PPD#1 Pt states that she is doing well. She states peds will not let baby go home because of + GBS. VSSAF IMP/ Stable Plan/ Routine care

## 2020-02-29 ENCOUNTER — Other Ambulatory Visit (HOSPITAL_COMMUNITY): Payer: 59

## 2020-02-29 ENCOUNTER — Inpatient Hospital Stay: Payer: 59

## 2020-02-29 DIAGNOSIS — Z23 Encounter for immunization: Secondary | ICD-10-CM

## 2020-02-29 NOTE — Progress Notes (Signed)
Pt discharged home with printed instructions. No concerns noted. Nil Bolser L Estoria Geary, RN  

## 2020-02-29 NOTE — Progress Notes (Signed)
Patient ID: Megan Moss, female   DOB: 1985/06/13, 34 y.o.   MRN: [id number]  Post Partum Day 2 Subjective: Doing well this morning without complaints. Ambulating, voiding, tolerating PO. Breastfeeding. Minimal lochia.  Objective: Patient Vitals for the past 24 hrs:  BP Temp Temp src Pulse Resp  02/29/20 1100 105/74 98 F (36.7 C) Oral 86 16  02/29/20 0548 99/69 97.9 F (36.6 C) Oral 80 --  02/28/20 2208 100/66 98.4 F (36.9 C) Oral 82 18  02/28/20 1404 110/71 98.4 F (36.9 C) Oral 92 17    Physical Exam:  General: alert, cooperative and no distress Lochia: appropriate Uterine Fundus: firm DVT Evaluation: No evidence of DVT seen on physical exam.  Recent Labs    02/27/20 0817 02/28/20 0559  WBC 17.3* 12.5*  HGB 13.9 11.5*  HCT 42.4 35.5*  PLT 287 257    No results for input(s): NA, K, CL, CO2CT, BUN, CREATININE, GLUCOSE, BILITOT, ALT, AST, ALKPHOS, PROT, ALBUMIN in the last 72 hours.  No results for input(s): CALCIUM, MG, PHOS in the last 72 hours.  No results for input(s): PROTIME, APTT, INR in the last 72 hours.  No results for input(s): PROTIME, APTT, INR, FIBRINOGEN in the last 72 hours. Assessment/Plan:  Megan Moss 34 y.o. G1P1001 PPD#2 sp SVD 1. PPC: Routine postpartum care 2. COVID vaccine: patient desires vaccine and dose was requested 3. Rh pos 4. Stable for discharge home. Given postpartum precautions.    LOS: 2 days   Charlett Nose 02/29/2020, 1:15 PM

## 2020-02-29 NOTE — Lactation Note (Addendum)
This note was copied from a baby's chart. Lactation Consultation Note  Patient Name: Girl Krystal Teachey SELTR'V Date: 02/29/2020   Baby 53 hours old.  < 6 lbs. Mother states baby has been sleepy at times and has been spoon feeding if baby will not latch. Suggest pumping with DEBP at home and giving volume back to her if she has not latched in 3.5 hours. Reviewed engorgement care and monitoring voids/stools. Feed on demand with cues.  Goal 8-12+ times per day after first 24 hrs.  Place baby STS if not cueing.  Observed feeding with intermittent swallows.  Encouraged mother to post pump and give volume back until stools/voids are frequent and color of stools has transitioned.       Maternal Data    Feeding    LATCH Score                   Interventions    Lactation Tools Discussed/Used     Consult Status      Dahlia Byes [facility] 02/29/2020, 11:59 AM

## 2020-02-29 NOTE — Discharge Summary (Signed)
Postpartum Discharge Summary     Patient Name: Megan Moss DOB: 08/16/1985 MRN: [id number]  Date of admission: 02/27/2020 Delivery date:02/27/2020  Delivering provider: Vanessa Kick  Date of discharge: 02/29/2020  Admitting diagnosis: Normal labor and delivery [O80] Spontaneous vaginal delivery [O80] Intrauterine pregnancy: [redacted]w[redacted]d    Secondary diagnosis:  Active Problems:   Normal labor and delivery   Spontaneous vaginal delivery  Additional problems: none    Discharge diagnosis: Term Pregnancy Delivered                                              Post partum procedures:none Augmentation: none Complications: None  Hospital course: Onset of Labor With Vaginal Delivery      34y.o. yo G1P1001 at 32w5das admitted in Active Labor on 02/27/2020. Patient had an uncomplicated labor course as follows:  Membrane Rupture Time/Date: 6:30 AM ,02/27/2020   Delivery Method:Vaginal, Spontaneous  Episiotomy: None  Lacerations:  3rd degree  Patient had an uncomplicated postpartum course.  She is ambulating, tolerating a regular diet, passing flatus, and urinating well. Patient is discharged home in stable condition on 02/29/20.  Newborn Data: Birth date:02/27/2020  Birth time:6:55 AM  Gender:Female  Living status:Living  Apgars:9 ,9  Weight:2784 g   Magnesium Sulfate received: No BMZ received: No Rhophylac:No MMR:No T-DaP:Given prenatally Flu: No Transfusion:No  Physical exam  Vitals:   02/28/20 0535 02/28/20 1404 02/28/20 2208 02/29/20 0548  BP: 102/71 110/71 100/66 99/69  Pulse: 72 92 82 80  Resp: _0 Temp: 98.6 F (37 C) 98.4 F (36.9 C) 98.4 F (36.9 C) 97.9 F (36.6 C)  TempSrc: Oral Oral Oral Oral   General: alert, cooperative and no distress Lochia: appropriate Uterine Fundus: firm DVT Evaluation: No evidence of DVT seen on physical exam. Labs: Lab Results  Component Value Date   WBC 12.5 (H) 02/28/2020   HGB 11.5 (L) 02/28/2020   HCT 35.5  (L) 02/28/2020   MCV 88.3 02/28/2020   PLT 257 02/28/2020   No flowsheet data found. Edinburgh Score: Edinburgh Postnatal Depression Scale Screening Tool 02/28/2020  I have been able to laugh and see the funny side of things. 0  I have looked forward with enjoyment to things. 0  I have blamed myself unnecessarily when things went wrong. 0  I have been anxious or worried for no good reason. 0  I have felt scared or panicky for no good reason. 0  Things have been getting on top of me. 0  I have been so unhappy that I have had difficulty sleeping. 0  I have felt sad or miserable. 0  I have been so unhappy that I have been crying. 0  The thought of harming myself has occurred to me. 0  Edinburgh Postnatal Depression Scale Total 0     After visit meds:  Allergies as of 02/29/2020   No Known Allergies     Medication List    STOP taking these medications   aspirin EC 81 MG tablet     TAKE these medications   cyclobenzaprine 10 MG tablet Commonly known as: FLEXERIL Take 1 tablet (10 mg total) by mouth 2 (two) times daily as needed for muscle spasms.   multivitamin-prenatal 27-0.8 MG Tabs tablet Take 1 tablet by mouth daily at 12 noon.   ondansetron 8 MG disintegrating  tablet Commonly known as: Zofran ODT Take 1 tablet (8 mg total) by mouth every 8 (eight) hours as needed for nausea or vomiting.   zolpidem 5 MG tablet Commonly known as: AMBIEN Take 1 tablet (5 mg total) by mouth at bedtime as needed for sleep.        Discharge home in stable condition Infant Feeding: Breast Infant Disposition:home with mother Discharge instruction: per After Visit Summary and Postpartum booklet. Activity: Advance as tolerated. Pelvic rest for 6 weeks.  Diet: routine diet Future Appointments:No future appointments. Follow up Visit: 4 weeks   Please schedule this patient for a In person postpartum visit in 4 weeks with the following provider: MD. Additional Postpartum F/U:none  Low  risk pregnancy  Delivery mode:  Vaginal, Spontaneous    02/29/2020 Rowland Lathe, MD

## 2020-02-29 NOTE — Plan of Care (Signed)
Patient to be discharged home with printed instructions. [name], RN  

## 2020-02-29 NOTE — Progress Notes (Signed)
° °  Covid-19 Vaccination Clinic  Name:  LOTOYA CASELLA    MRN: [id number] DOB: [birth year]  02/29/2020  Ms. Barillas was observed post Covid-19 immunization for 15 minutes without incident. She was provided with Vaccine Information Sheet and instruction to access the V-Safe system.   Ms. Crosby was instructed to call 911 with any severe reactions post vaccine:  Difficulty breathing   Swelling of face and throat   A fast heartbeat   A bad rash all over body   Dizziness and weakness   Immunizations Administered    Name Date Dose VIS Date Route   Pfizer COVID-19 Vaccine 02/29/2020 11:46 AM 0.3 mL 08/03/2018 Intramuscular   Manufacturer: [employer]   Lot: [id number]   NDC: [id number]

## 2020-03-02 ENCOUNTER — Inpatient Hospital Stay (HOSPITAL_COMMUNITY): Admission: AD | Admit: 2020-03-02 | Payer: 59 | Source: Home / Self Care | Admitting: Obstetrics and Gynecology

## 2020-03-02 ENCOUNTER — Inpatient Hospital Stay (HOSPITAL_COMMUNITY): Payer: 59

## 2020-04-12 DIAGNOSIS — Z3483 Encounter for supervision of other normal pregnancy, third trimester: Secondary | ICD-10-CM | POA: Diagnosis not present

## 2020-04-12 DIAGNOSIS — Z3482 Encounter for supervision of other normal pregnancy, second trimester: Secondary | ICD-10-CM | POA: Diagnosis not present

## 2020-05-23 DIAGNOSIS — Z3482 Encounter for supervision of other normal pregnancy, second trimester: Secondary | ICD-10-CM | POA: Diagnosis not present

## 2020-05-23 DIAGNOSIS — Z3483 Encounter for supervision of other normal pregnancy, third trimester: Secondary | ICD-10-CM | POA: Diagnosis not present

## 2020-06-28 DIAGNOSIS — Z3482 Encounter for supervision of other normal pregnancy, second trimester: Secondary | ICD-10-CM | POA: Diagnosis not present

## 2020-06-28 DIAGNOSIS — Z3483 Encounter for supervision of other normal pregnancy, third trimester: Secondary | ICD-10-CM | POA: Diagnosis not present

## 2020-07-20 DIAGNOSIS — Z3483 Encounter for supervision of other normal pregnancy, third trimester: Secondary | ICD-10-CM | POA: Diagnosis not present

## 2020-07-20 DIAGNOSIS — Z3482 Encounter for supervision of other normal pregnancy, second trimester: Secondary | ICD-10-CM | POA: Diagnosis not present

## 2021-02-20 IMAGING — US US OB < 14 WEEKS - US OB TV
1 series · 15 of 28 positions shown · non-contrast
Comparison: None.

CLINICAL DATA: Pain, cramping, vaginal bleeding

EXAM:
OBSTETRIC <14 WK US AND TRANSVAGINAL OB US
TECHNIQUE: Both transabdominal and transvaginal ultrasound examinations were
performed for complete evaluation of the gestation as well as the
maternal uterus, adnexal regions, and pelvic cul-de-sac.
Transvaginal technique was performed to assess early pregnancy.

[Series 1: us ob < 14 weeks - us ob tv · 15 of 32 slices shown]
[im 1/32]
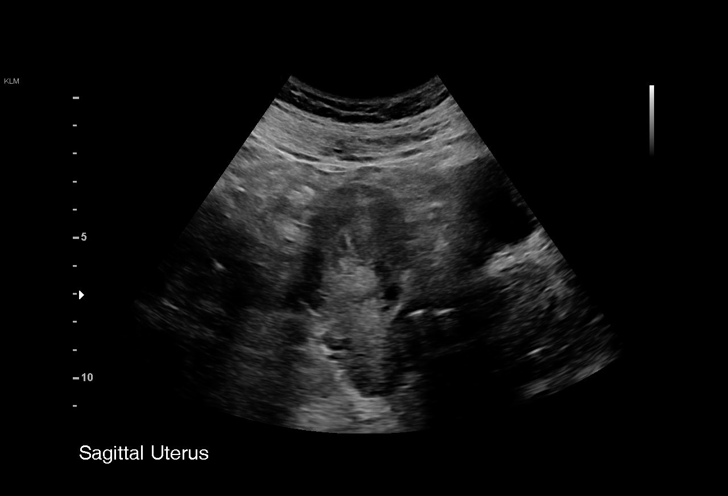
[im 3/32]
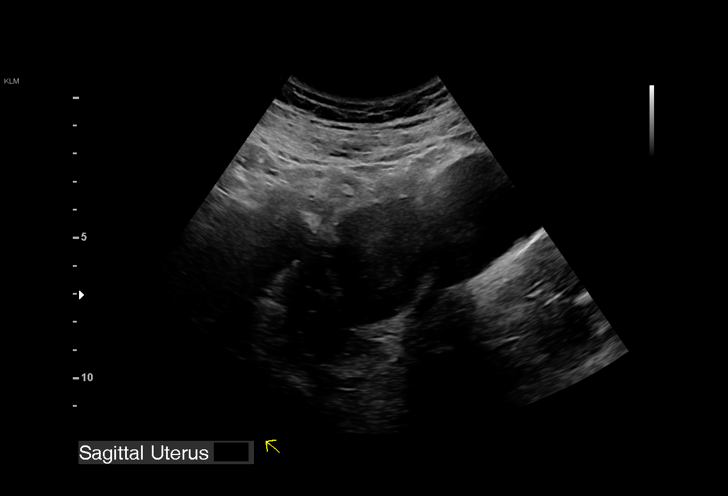
[im 5/32]
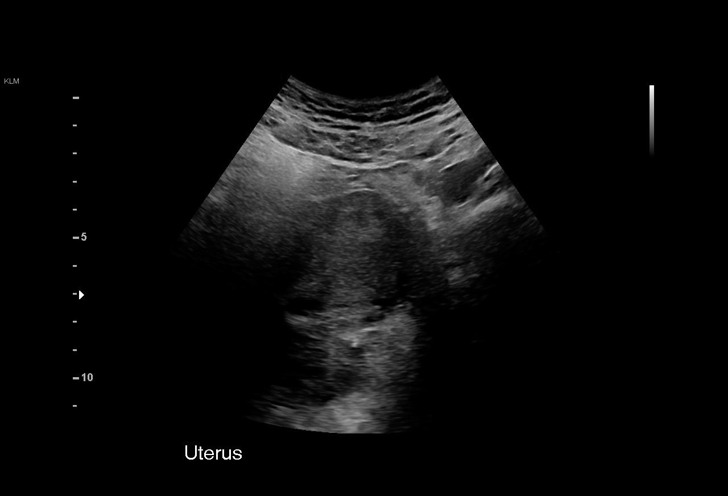
[im 7/32]
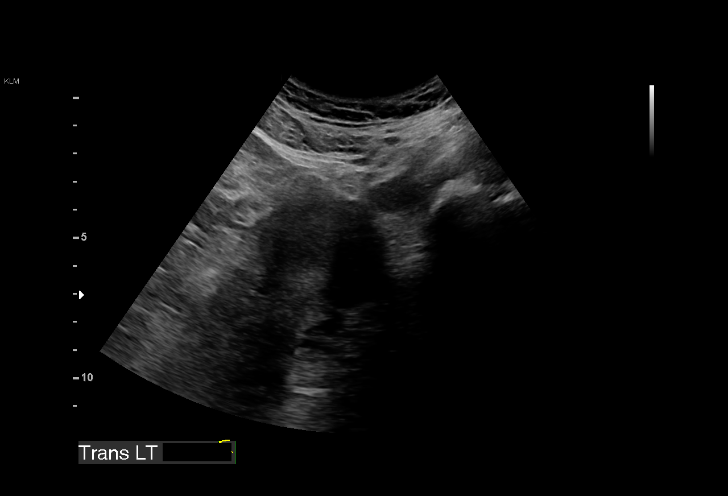
[im 10/32]
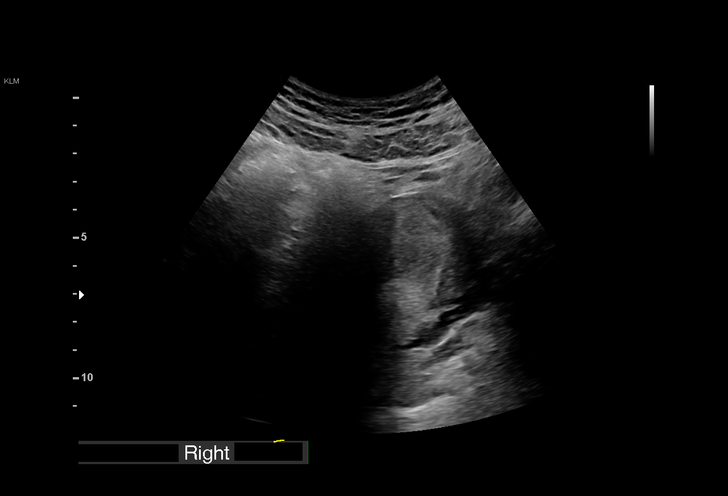
[im 12/32]
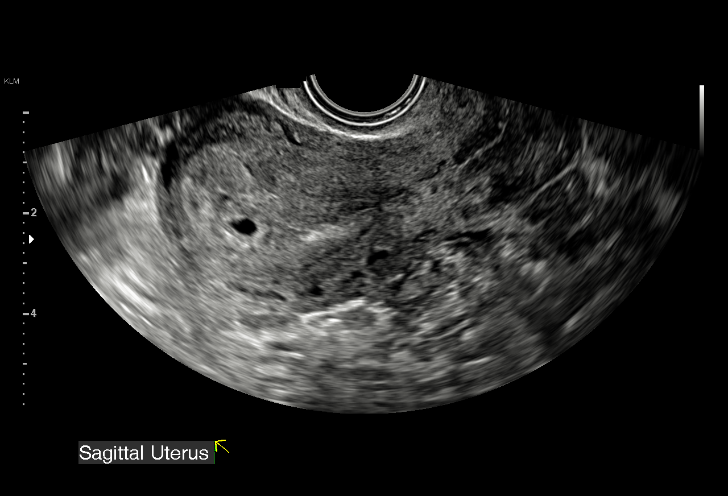
[im 14/32]
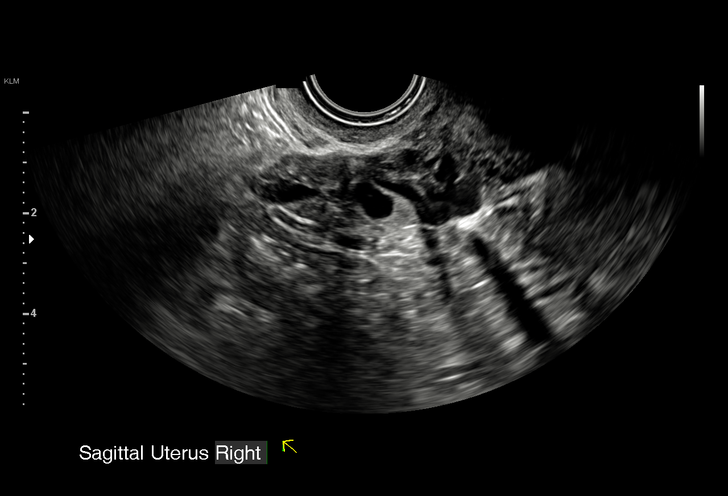
[im 17/32]
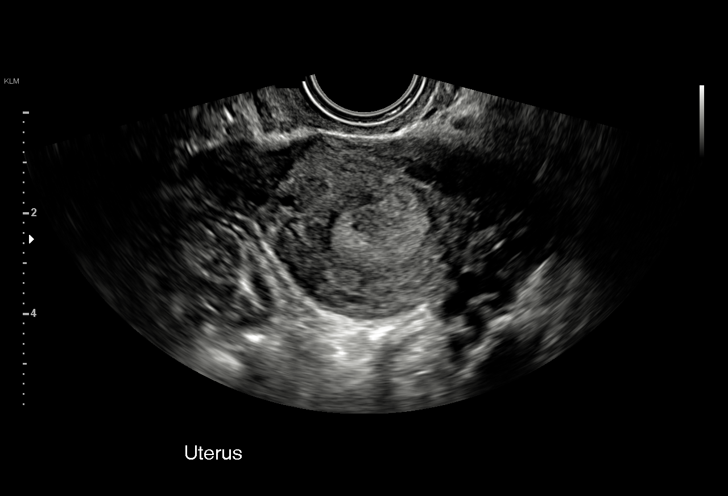
[im 18/32]
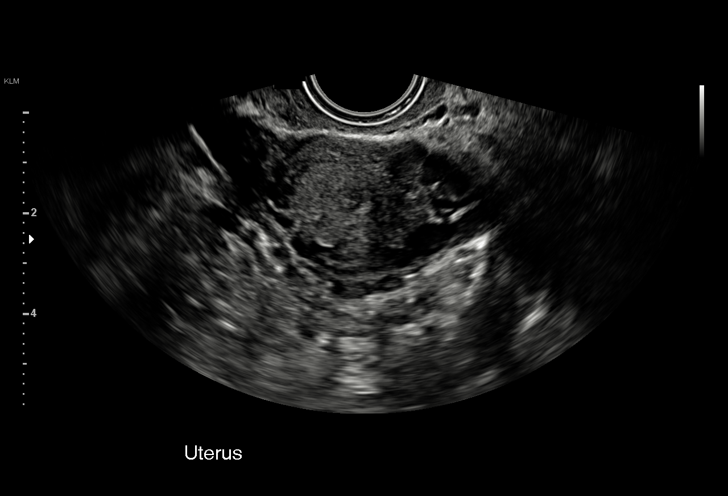
[im 20/32]
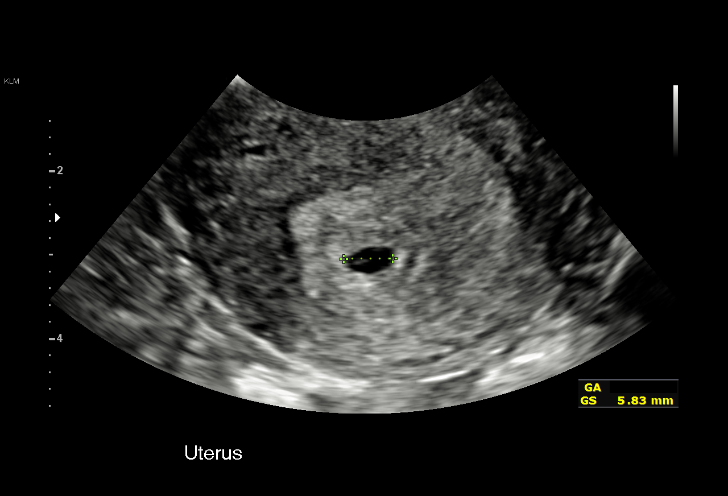
[im 22/32]
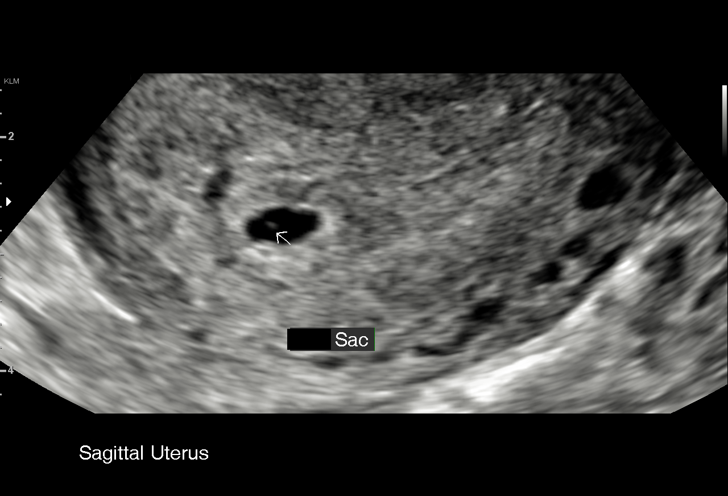
[im 25/32]
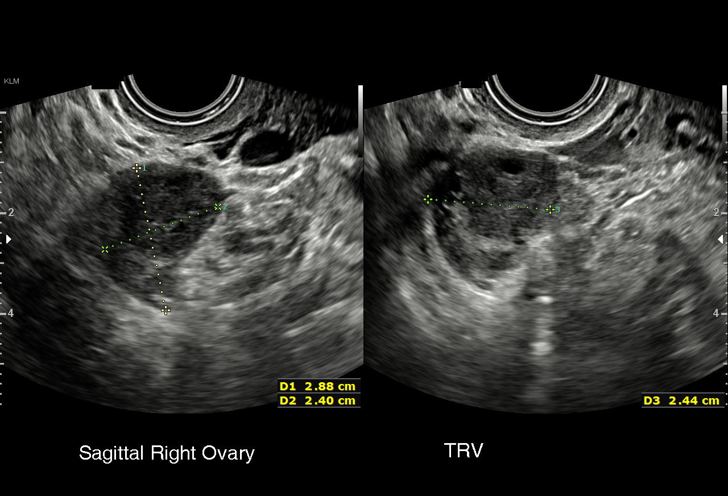
[im 27/32]
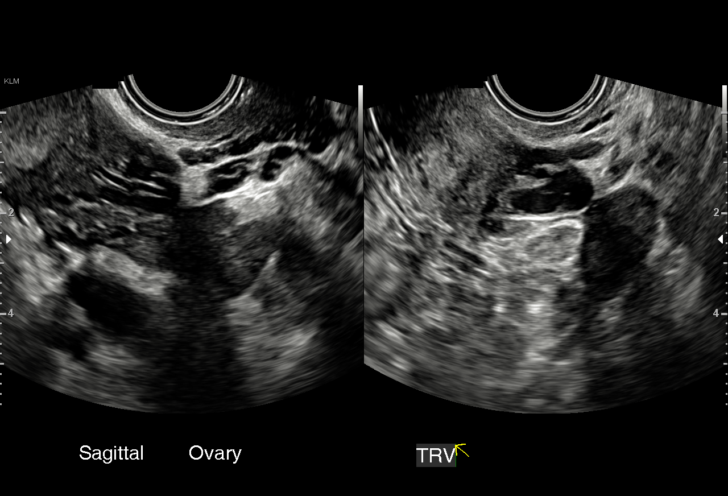
[im 29/32]
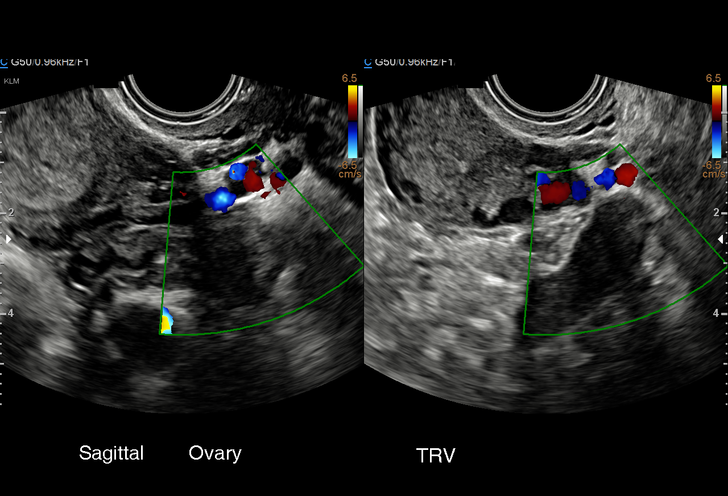
[im 32/32]
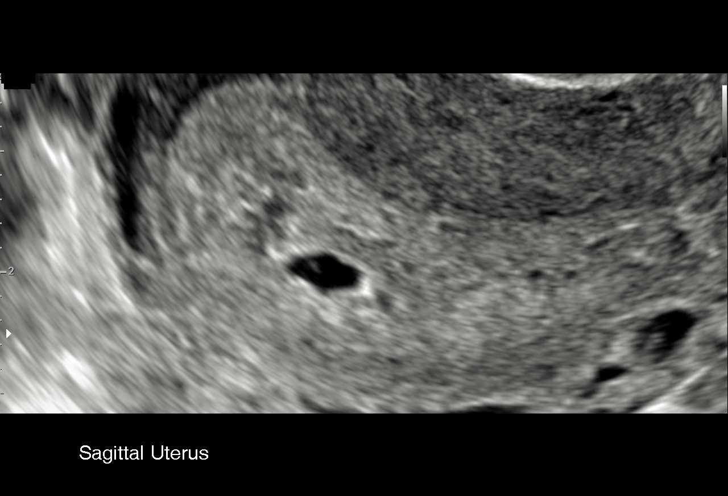

[15 of 28 positions shown; findings below may reference images not displayed]

FINDINGS: Intrauterine gestational sac: Single

Yolk sac:  Visualized

Embryo:  Not visualized

Cardiac Activity: Not visualized

Heart Rate:   bpm

MSD: 5.4 mm   5 w   2 d

CRL:    mm    w    d                  US EDC:

Subchorionic hemorrhage:  None visualized.

Maternal uterus/adnexae: No adnexal mass or free fluid.
IMPRESSION: Early intrauterine pregnancy. Yolk sac is visualized but no fetal
pole currently. Estimated gestational age by mean sac diameter 5
weeks 2 days. This could be followed with repeat ultrasound in 14
days to ensure expected progression. No acute maternal findings.

## 2022-04-16 ENCOUNTER — Other Ambulatory Visit (HOSPITAL_COMMUNITY): Payer: Self-pay
